# Patient Record
Sex: Male | Born: 1970 | Race: Black or African American | Hispanic: No | Marital: Married | Smoking: Former smoker
Health system: Southern US, Community
[De-identification: ages and names within clinical notes are randomized; demographics above are authoritative.]

## PROBLEM LIST (undated history)

## (undated) DIAGNOSIS — K8020 Calculus of gallbladder without cholecystitis without obstruction: Secondary | ICD-10-CM

## (undated) DIAGNOSIS — M545 Low back pain, unspecified: Secondary | ICD-10-CM

## (undated) DIAGNOSIS — R51 Headache: Secondary | ICD-10-CM

## (undated) DIAGNOSIS — G8929 Other chronic pain: Secondary | ICD-10-CM

## (undated) DIAGNOSIS — K859 Acute pancreatitis without necrosis or infection, unspecified: Secondary | ICD-10-CM

## (undated) DIAGNOSIS — I10 Essential (primary) hypertension: Secondary | ICD-10-CM

## (undated) HISTORY — DX: Essential (primary) hypertension: I10

## (undated) HISTORY — DX: Calculus of gallbladder without cholecystitis without obstruction: K80.20

## (undated) HISTORY — DX: Low back pain: M54.5

## (undated) HISTORY — PX: APPENDECTOMY: SHX54

## (undated) HISTORY — DX: Low back pain, unspecified: M54.50

## (undated) HISTORY — DX: Acute pancreatitis without necrosis or infection, unspecified: K85.90

## (undated) HISTORY — PX: LAPAROTOMY: SHX154

## (undated) HISTORY — PX: CHOLECYSTECTOMY: SHX55

## (undated) HISTORY — PX: COLON SURGERY: SHX602

## (undated) HISTORY — DX: Other chronic pain: G89.29

## (undated) HISTORY — DX: Headache: R51

---

## 2004-04-28 ENCOUNTER — Emergency Department (HOSPITAL_COMMUNITY): Admission: EM | Admit: 2004-04-28 | Discharge: 2004-04-29 | Payer: Self-pay | Admitting: Emergency Medicine

## 2006-01-19 ENCOUNTER — Emergency Department (HOSPITAL_COMMUNITY): Admission: EM | Admit: 2006-01-19 | Discharge: 2006-01-19 | Payer: Self-pay | Admitting: Family Medicine

## 2006-06-22 ENCOUNTER — Emergency Department (HOSPITAL_COMMUNITY): Admission: EM | Admit: 2006-06-22 | Discharge: 2006-06-22 | Payer: Self-pay | Admitting: Family Medicine

## 2007-06-29 ENCOUNTER — Emergency Department (HOSPITAL_COMMUNITY): Admission: EM | Admit: 2007-06-29 | Discharge: 2007-06-29 | Payer: Self-pay | Admitting: Family Medicine

## 2007-08-22 ENCOUNTER — Encounter: Admission: RE | Admit: 2007-08-22 | Discharge: 2007-10-27 | Payer: Self-pay | Admitting: Occupational Medicine

## 2007-09-29 ENCOUNTER — Encounter: Admission: RE | Admit: 2007-09-29 | Discharge: 2007-09-29 | Payer: Self-pay | Admitting: Occupational Medicine

## 2008-05-11 ENCOUNTER — Inpatient Hospital Stay (HOSPITAL_COMMUNITY): Admission: EM | Admit: 2008-05-11 | Discharge: 2008-05-15 | Payer: Self-pay | Admitting: Emergency Medicine

## 2008-05-14 ENCOUNTER — Encounter (INDEPENDENT_AMBULATORY_CARE_PROVIDER_SITE_OTHER): Payer: Self-pay | Admitting: General Surgery

## 2009-09-08 ENCOUNTER — Emergency Department (HOSPITAL_COMMUNITY): Admission: EM | Admit: 2009-09-08 | Discharge: 2009-09-08 | Payer: Self-pay | Admitting: Family Medicine

## 2009-09-15 ENCOUNTER — Ambulatory Visit: Payer: Self-pay | Admitting: Internal Medicine

## 2009-09-15 DIAGNOSIS — I10 Essential (primary) hypertension: Secondary | ICD-10-CM

## 2009-09-15 DIAGNOSIS — Z87442 Personal history of urinary calculi: Secondary | ICD-10-CM | POA: Insufficient documentation

## 2009-10-09 ENCOUNTER — Ambulatory Visit: Payer: Self-pay | Admitting: Internal Medicine

## 2009-10-09 DIAGNOSIS — B353 Tinea pedis: Secondary | ICD-10-CM

## 2009-10-09 DIAGNOSIS — B351 Tinea unguium: Secondary | ICD-10-CM | POA: Insufficient documentation

## 2010-02-12 ENCOUNTER — Encounter: Payer: Self-pay | Admitting: Internal Medicine

## 2010-02-24 ENCOUNTER — Telehealth: Payer: Self-pay | Admitting: Internal Medicine

## 2010-03-02 ENCOUNTER — Ambulatory Visit: Payer: Self-pay | Admitting: Internal Medicine

## 2010-03-02 DIAGNOSIS — M5440 Lumbago with sciatica, unspecified side: Secondary | ICD-10-CM | POA: Insufficient documentation

## 2010-03-02 DIAGNOSIS — M549 Dorsalgia, unspecified: Secondary | ICD-10-CM | POA: Insufficient documentation

## 2010-03-06 ENCOUNTER — Ambulatory Visit (HOSPITAL_COMMUNITY): Admission: RE | Admit: 2010-03-06 | Discharge: 2010-03-06 | Payer: Self-pay | Admitting: Internal Medicine

## 2010-03-10 ENCOUNTER — Telehealth: Payer: Self-pay | Admitting: Internal Medicine

## 2010-04-13 ENCOUNTER — Ambulatory Visit: Payer: Self-pay | Admitting: Internal Medicine

## 2010-04-13 ENCOUNTER — Encounter (INDEPENDENT_AMBULATORY_CARE_PROVIDER_SITE_OTHER): Payer: Self-pay | Admitting: *Deleted

## 2010-04-13 DIAGNOSIS — R51 Headache: Secondary | ICD-10-CM

## 2010-04-13 DIAGNOSIS — R519 Headache, unspecified: Secondary | ICD-10-CM | POA: Insufficient documentation

## 2010-05-07 ENCOUNTER — Encounter
Admission: RE | Admit: 2010-05-07 | Discharge: 2010-06-30 | Payer: Self-pay | Source: Home / Self Care | Attending: Internal Medicine | Admitting: Internal Medicine

## 2010-05-15 ENCOUNTER — Ambulatory Visit: Payer: Self-pay | Admitting: Physical Medicine & Rehabilitation

## 2010-05-19 ENCOUNTER — Encounter: Payer: Self-pay | Admitting: Internal Medicine

## 2010-05-30 ENCOUNTER — Encounter
Admission: RE | Admit: 2010-05-30 | Discharge: 2010-05-30 | Payer: Self-pay | Source: Home / Self Care | Attending: Neurology | Admitting: Neurology

## 2010-06-17 ENCOUNTER — Ambulatory Visit (HOSPITAL_BASED_OUTPATIENT_CLINIC_OR_DEPARTMENT_OTHER)
Admission: RE | Admit: 2010-06-17 | Discharge: 2010-06-17 | Payer: Self-pay | Source: Home / Self Care | Attending: Neurology | Admitting: Neurology

## 2010-06-25 ENCOUNTER — Encounter
Admission: RE | Admit: 2010-06-25 | Discharge: 2010-06-30 | Payer: Self-pay | Source: Home / Self Care | Attending: Physical Medicine & Rehabilitation | Admitting: Physical Medicine & Rehabilitation

## 2010-06-26 ENCOUNTER — Ambulatory Visit
Admission: RE | Admit: 2010-06-26 | Discharge: 2010-06-26 | Payer: Self-pay | Source: Home / Self Care | Attending: Internal Medicine | Admitting: Internal Medicine

## 2010-06-28 LAB — CONVERTED CEMR LAB
ALT: 35 units/L (ref 0–53)
AST: 36 units/L (ref 0–37)
Albumin: 4.3 g/dL (ref 3.5–5.2)
Alkaline Phosphatase: 55 units/L (ref 39–117)
BUN: 9 mg/dL (ref 6–23)
Basophils Absolute: 0 10*3/uL (ref 0.0–0.1)
Basophils Relative: 0.6 % (ref 0.0–3.0)
Bilirubin, Direct: 0.1 mg/dL (ref 0.0–0.3)
CO2: 32 meq/L (ref 19–32)
Calcium: 9.3 mg/dL (ref 8.4–10.5)
Chloride: 104 meq/L (ref 96–112)
Cholesterol: 187 mg/dL (ref 0–200)
Creatinine, Ser: 1.1 mg/dL (ref 0.4–1.5)
Direct LDL: 108.7 mg/dL
Eosinophils Absolute: 0.1 10*3/uL (ref 0.0–0.7)
Eosinophils Relative: 1.2 % (ref 0.0–5.0)
GFR calc non Af Amer: 99.52 mL/min (ref >60)
Glucose, Bld: 82 mg/dL (ref 70–99)
HCT: 44.9 % (ref 39.0–52.0)
HDL: 35.8 mg/dL — ABNORMAL LOW (ref >39.00)
Hemoglobin: 15.5 g/dL (ref 13.0–17.0)
Lymphocytes Relative: 35.1 % (ref 12.0–46.0)
Lymphs Abs: 2.4 10*3/uL (ref 0.7–4.0)
MCHC: 34.5 g/dL (ref 30.0–36.0)
MCV: 83.8 fL (ref 78.0–100.0)
Monocytes Absolute: 0.5 10*3/uL (ref 0.1–1.0)
Monocytes Relative: 7.8 % (ref 3.0–12.0)
Neutro Abs: 3.7 10*3/uL (ref 1.4–7.7)
Neutrophils Relative %: 55.3 % (ref 43.0–77.0)
Platelets: 329 10*3/uL (ref 150.0–400.0)
Potassium: 4.1 meq/L (ref 3.5–5.1)
RBC: 5.35 M/uL (ref 4.22–5.81)
RDW: 13.5 % (ref 11.5–14.6)
Sodium: 142 meq/L (ref 135–145)
TSH: 2.06 microintl units/mL (ref 0.35–5.50)
Total Bilirubin: 0.8 mg/dL (ref 0.3–1.2)
Total CHOL/HDL Ratio: 5
Total Protein: 7.6 g/dL (ref 6.0–8.3)
Triglycerides: 255 mg/dL — ABNORMAL HIGH (ref 0.0–149.0)
VLDL: 51 mg/dL — ABNORMAL HIGH (ref 0.0–40.0)
WBC: 6.8 10*3/uL (ref 4.5–10.5)

## 2010-06-30 NOTE — Assessment & Plan Note (Signed)
Summary: back pain--d/t---stc   Vital Signs:  Patient profile:   40 year old male Height:      70 inches Weight:      253 pounds BMI:     36.43 O2 Sat:      97 % on Room air Temp:     98.2 degrees F oral Pulse rate:   67 / minute Pulse rhythm:   regular Resp:     16 per minute BP sitting:   130 / 82  (left arm) Cuff size:   large  Vitals Entered By: Rock Nephew CMA (March 02, 2010 3:44 PM)  Nutrition Counseling: Patient's BMI is greater than 25 and therefore counseled on weight management options.  O2 Flow:  Room air CC: pt c/o back pain x 2wks, Back pain   Primary Care Provider:  Etta Grandchild MD  CC:  pt c/o back pain x 2wks and Back pain.  History of Present Illness:  Back Pain      This is a 40 year old man who presents with Back pain.  The symptoms began 2 weeks ago.  The intensity is described as moderate.  The patient denies fever, chills, weakness, loss of sensation, fecal incontinence, urinary incontinence, urinary retention, dysuria, rest pain, inability to work, and inability to care for self.  The pain is located in the left low back.  The pain radiates to the left leg below the knee.  The pain is made worse by flexion and extension.  The pain is made better by inactivity, NSAID medications, heat, and ice.    Preventive Screening-Counseling & Management  Alcohol-Tobacco     Alcohol drinks/day: 0     Smoking Status: never     Tobacco Counseling: not indicated; no tobacco use  Hep-HIV-STD-Contraception     Hepatitis Risk: no risk noted     HIV Risk: no risk noted     STD Risk: no risk noted     TSE monthly: yes     Testicular SE Education/Counseling to perform regular STE  Clinical Review Panels:  Immunizations   Last Tetanus Booster:  Td (09/28/2008)   Last Flu Vaccine:  Historical (02/28/2009)   Last Pneumovax:  Historical (05/31/2008)  Lipid Management   Cholesterol:  187 (10/09/2009)   HDL (good cholesterol):  35.80  (10/09/2009)  Diabetes Management   Creatinine:  1.1 (10/09/2009)   Last Flu Vaccine:  Historical (02/28/2009)   Last Pneumovax:  Historical (05/31/2008)  CBC   WBC:  6.8 (10/09/2009)   RBC:  5.35 (10/09/2009)   Hgb:  15.5 (10/09/2009)   Hct:  44.9 (10/09/2009)   Platelets:  329.0 (10/09/2009)   MCV  83.8 (10/09/2009)   MCHC  34.5 (10/09/2009)   RDW  13.5 (10/09/2009)   PMN:  55.3 (10/09/2009)   Lymphs:  35.1 (10/09/2009)   Monos:  7.8 (10/09/2009)   Eosinophils:  1.2 (10/09/2009)   Basophil:  0.6 (10/09/2009)  Complete Metabolic Panel   Glucose:  82 (10/09/2009)   Sodium:  142 (10/09/2009)   Potassium:  4.1 (10/09/2009)   Chloride:  104 (10/09/2009)   CO2:  32 (10/09/2009)   BUN:  9 (10/09/2009)   Creatinine:  1.1 (10/09/2009)   Albumin:  4.3 (10/09/2009)   Total Protein:  7.6 (10/09/2009)   Calcium:  9.3 (10/09/2009)   Total Bili:  0.8 (10/09/2009)   Alk Phos:  55 (10/09/2009)   SGPT (ALT):  35 (10/09/2009)   SGOT (AST):  36 (10/09/2009)   Medications Prior  to Update: 1)  Terbinafine Hcl 250 Mg Tabs (Terbinafine Hcl) .... One By Mouth Once Daily For Toenail Fungus 2)  Loprox 0.77 % Gel (Ciclopirox) .... Apply To Feet Two Times A Day For 14 Days For Foot Fungus.  Current Medications (verified): 1)  Terbinafine Hcl 250 Mg Tabs (Terbinafine Hcl) .... One By Mouth Once Daily For Toenail Fungus 2)  Tramadol Hcl 50 Mg Tabs (Tramadol Hcl) .... 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 Family History of Arthritis Family History Diabetes 1st degree relative  Social History: Reviewed history from 09/15/2009 and no changes required. 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    Physical Exam  General:  alert, well-developed, well-nourished, well-hydrated, appropriate dress, normal appearance, healthy-appearing, and cooperative to examination.   Mouth:  Oral mucosa and oropharynx without lesions or exudates.  Teeth in good repair. Neck:  supple, full ROM, no masses, no thyromegaly, no thyroid nodules or tenderness, no JVD, normal carotid upstroke, no carotid bruits, no cervical lymphadenopathy, and no neck tenderness.   Lungs:  normal respiratory effort, no intercostal retractions, no accessory muscle use, normal breath sounds, no dullness, no fremitus, no crackles, and no wheezes.   Heart:  normal rate, regular rhythm, no murmur, no gallop, no rub, and no JVD.   Abdomen:  soft, non-tender, normal bowel sounds, no distention, no masses, no guarding, no rigidity, no rebound tenderness, no abdominal hernia, no inguinal hernia, no hepatomegaly, no splenomegaly, and abdominal scar(s).   Msk:  No deformity or scoliosis noted of thoracic or lumbar spine.   Pulses:  R and L carotid,radial,femoral,dorsalis pedis and posterior tibial pulses are full and equal  bilaterally Extremities:  No clubbing, cyanosis, edema, or deformity noted with normal full range of motion of all joints.   Neurologic:  No cranial nerve deficits noted. Station and gait are normal. Plantar reflexes are down-going bilaterally. DTRs are symmetrical throughout. Sensory, motor and coordinative functions appear intact. Skin:  turgor normal, color normal,  no suspicious lesions, no ecchymoses, no petechiae, no purpura, no ulcerations, no edema, and tattoo(s).  He has diffuse scaling on the plantar surfaces of both feet. Psych:  Cognition and judgment appear intact. Alert and cooperative with normal attention span and concentration. No apparent delusions, illusions, hallucinations   Detailed Back/Spine Exam  General:    obese.    Gait:    Normal heel-toe gait pattern bilaterally.    Lumbosacral Exam:  Inspection-deformity:    Normal Palpation-spinal tenderness:  Normal Range of Motion:    Forward Flexion:   85 degrees    Hyperextension:   30 degrees    Right Lateral Bend:   30 degrees  Left Lateral Bend:   30 degrees Squatting:  normal Lying Straight Leg Raise:    Right:  negative    Left:  negative Sitting Straight Leg Raise:    Right:  negative    Left:  negative Reverse Straight Leg Raise:    Right:  negative    Left:  negative Contralateral Straight Leg Raise:    Right:  negative    Left:  negative Sciatic Notch:    There is no sciatic notch tenderness. Toe Walking:    Right:  normal    Left:  normal Heel Walking:    Right:  normal    Left:  normal   Impression & Recommendations:  Problem # 1:  BACK PAIN (ICD-724.5) Assessment New  His updated medication list for this problem includes:    Tramadol Hcl 50 Mg Tabs (Tramadol hcl) ..... One by mouth qid as needed for low back pain  Problem # 2:  LUMBAR RADICULOPATHY, LEFT (ICD-724.4) Assessment: New will look for spinal cord lesion, hnp, ddd, spinal stenosis, nerve impingement His updated  medication list for this problem includes:    Tramadol Hcl 50 Mg Tabs (Tramadol hcl) ..... One by mouth qid as needed for low back pain  Orders: Radiology Referral (Radiology)  Problem # 3:  ELEVATED BP W/O HYPERTENSION (ICD-796.2) Assessment: Improved  BP today: 130/82 Prior BP: 120/82 (10/09/2009)  Labs Reviewed: Creat: 1.1 (10/09/2009) Chol: 187 (10/09/2009)   HDL: 35.80 (10/09/2009)   TG: 255.0 (10/09/2009)  Instructed in low sodium diet (DASH Handout) and behavior modification.    Complete Medication List: 1)  Terbinafine Hcl 250 Mg Tabs (Terbinafine hcl) .... One by mouth once daily for toenail fungus 2)  Tramadol Hcl 50 Mg Tabs (Tramadol hcl) .... One by mouth qid as needed for low back pain  Patient Instructions: 1)  Please schedule a follow-up appointment in 1 month. 2)  It is important that you exercise regularly at least 20 minutes 5 times a week. If you develop chest pain, have severe difficulty breathing, or feel very tired , stop exercising immediately and seek medical attention. 3)  You need to lose weight. Consider a lower calorie diet and regular exercise.  4)  Take 650-1000mg  of Tylenol every 4-6 hours as needed for relief of pain or comfort of fever AVOID taking more than 4000mg   in a 24 hour period (can cause liver damage in higher doses). 5)  Take 400-600mg  of Ibuprofen (Advil, Motrin) with food every 4-6 hours as needed for relief of pain or comfort of fever. 6)  Most patients (90%) with low back pain will improve with time (2-6 weeks). Keep active but avoid activities that are painful. Apply moist heat and/or ice to lower back several times a day. Prescriptions: TRAMADOL HCL 50 MG TABS (TRAMADOL HCL) One by mouth QID as needed for low back pain  #50 x 3   Entered and Authorized by:   Etta Grandchild MD   Signed by:   Etta Grandchild MD on 03/02/2010   Method used:   Electronically to        [facility]* (retail)       [address]        [address] Shipping/mailing       [place], [place]  [address]       Ph: [phone]       Fax: [phone]   RxID:   [id number]

## 2010-06-30 NOTE — Letter (Signed)
Summary: Out of Work  [facility]  [address] [place], [place] [address]   Phone: [phone]  Fax: [phone]    March 02, 2010   Employee:  [name]    To Whom It May Concern:   For Medical reasons, please excuse the above named employee from work for the following dates:  Monday 03/02/2010  If you need additional information, please feel free to contact our office.         Sincerely,    Alvy Beal A CMA for Dr. Sanda Linger

## 2010-06-30 NOTE — Assessment & Plan Note (Signed)
Summary: PER SARAH 1 MTH FU--STC   Vital Signs:  Patient profile:   40 year old male Height:      70 inches Weight:      254 pounds BMI:     36.58 O2 Sat:      97 % on Room air Temp:     98.2 degrees F oral Pulse rate:   64 / minute Pulse rhythm:   regular Resp:     16 per minute BP sitting:   142 / 90  (left arm) Cuff size:   large  Vitals Entered By: Rock Nephew CMA (April 13, 2010 9:47 AM)  Nutrition Counseling: Patient's BMI is greater than 25 and therefore counseled on weight management options.  O2 Flow:  Room air CC: follow-up visit Is Patient Diabetic? No Pain Assessment Patient in pain? no       Does patient need assistance? Functional Status Self care Ambulation Normal   Primary Care Provider:  Etta Grandchild MD  CC:  follow-up visit.  History of Present Illness: He returns for f/up and despite very little abnormality on his MRI he still has low back pain. His low back pain responds well to Goody's and tramadol.  Also, he tells me that he has had chronic daily headache since 2010 when he sustained 2 head injuries while working ay [employer]. His wife is concerned b/c she believes that he takes a medicine everyday for headache.  He does not want to take a med for high blood pressure but he requests an updated referral for a nutrition consultation.  Preventive Screening-Counseling & Management  Alcohol-Tobacco     Alcohol drinks/day: 0     Alcohol Counseling: not indicated; patient does not drink     Smoking Status: never     Tobacco Counseling: not indicated; no tobacco use  Hep-HIV-STD-Contraception     Hepatitis Risk: no risk noted     HIV Risk: no risk noted     STD Risk: no risk noted     TSE monthly: yes     Testicular SE Education/Counseling to perform regular STE      Sexual History:  currently monogamous.        Drug Use:  never and no.        Blood Transfusions:  no.    Medications Prior to Update: 1)  Terbinafine Hcl 250 Mg Tabs  (Terbinafine Hcl) .... One By Mouth Once Daily For Toenail Fungus 2)  Tramadol Hcl 50 Mg Tabs (Tramadol Hcl) .... One By Mouth Qid As Needed For Low Back Pain  Current Medications (verified): 1)  Terbinafine Hcl 250 Mg Tabs (Terbinafine Hcl) .... One By Mouth Once Daily For Toenail Fungus 2)  Tramadol Hcl 50 Mg Tabs (Tramadol Hcl) .... One By Mouth Qid As Needed For Low Back Pain  Allergies (verified): No Known Drug Allergies  Past History:  Past Medical History: Last updated: 09/15/2009 Gallstones ----> hepatitis/pancreatitis in 2009 [facility]------------> headaches Nephrolithiasis, hx of  Past Surgical History: Last updated: 09/15/2009 Appendectomy Cholecystectomy Exploratory laparotomy  Family History: Last updated: 09/15/2009 Family History of Arthritis Family History Diabetes 1st degree relative  Social History: Last updated: 04/13/2010 Occupation: [employer] Married Never Smoked Alcohol use-no Drug use-no Regular exercise-yes  Risk Factors: Alcohol Use: 0 (04/13/2010) Exercise: yes (09/15/2009)  Risk Factors: Smoking Status: never (04/13/2010)  Family History: Reviewed history from 09/15/2009 and no changes required. Family History of Arthritis Family History Diabetes 1st degree relative  Social History: Reviewed history from 09/15/2009 and  no changes required. Occupation: [employer] Married Never Smoked Alcohol use-no Drug use-no Regular exercise-yes  Review of Systems       The patient complains of weight gain and headaches.  The patient denies anorexia, fever, weight loss, decreased hearing, hoarseness, chest pain, syncope, dyspnea on exertion, peripheral edema, prolonged cough, hemoptysis, abdominal pain, hematuria, suspicious skin lesions, transient blindness, difficulty walking, and depression.   Neuro:  Denies brief paralysis, difficulty with concentration, disturbances in coordination, falling down, inability to speak, memory loss, numbness, poor  balance, seizures, sensation of room spinning, tingling, tremors, visual disturbances, and weakness.  Physical Exam  General:  alert, well-developed, well-nourished, well-hydrated, appropriate dress, normal appearance, healthy-appearing, and overweight-appearing.   Head:  normocephalic, atraumatic, no abnormalities observed, and no abnormalities palpated.   Eyes:  vision grossly intact, pupils equal, pupils round, and pupils reactive to light.   Mouth:  Oral mucosa and oropharynx without lesions or exudates.  Teeth in good repair. Neck:  supple, full ROM, no masses, no thyromegaly, no thyroid nodules or tenderness, no JVD, normal carotid upstroke, no carotid bruits, no cervical lymphadenopathy, and no neck tenderness.   Lungs:  normal respiratory effort, no intercostal retractions, no accessory muscle use, normal breath sounds, no dullness, no fremitus, no crackles, and no wheezes.   Heart:  normal rate, regular rhythm, no murmur, no gallop, no rub, and no JVD.   Abdomen:  soft, non-tender, normal bowel sounds, no distention, no masses, no guarding, no rigidity, no rebound tenderness, no abdominal hernia, no inguinal hernia, no hepatomegaly, no splenomegaly, and abdominal scar(s).   Msk:  No deformity or scoliosis noted of thoracic or lumbar spine.   Pulses:  R and L carotid,radial,femoral,dorsalis pedis and posterior tibial pulses are full and equal bilaterally Extremities:  No clubbing, cyanosis, edema, or deformity noted with normal full range of motion of all joints.   Neurologic:  No cranial nerve deficits noted. Station and gait are normal. Plantar reflexes are down-going bilaterally. DTRs are symmetrical throughout. Sensory, motor and coordinative functions appear intact. Skin:  turgor normal, color normal, no rashes, no suspicious lesions, no ecchymoses, no petechiae, no purpura, no ulcerations, and no edema.   Cervical Nodes:  no anterior cervical adenopathy and no posterior cervical  adenopathy.   Psych:  Cognition and judgment appear intact. Alert and cooperative with normal attention span and concentration. No apparent delusions, illusions, hallucinations   Impression & Recommendations:  Problem # 1:  HEADACHE (ICD-784.0) Assessment New  His updated medication list for this problem includes:    Tramadol Hcl 50 Mg Tabs (Tramadol hcl) ..... One by mouth qid as needed for low back pain  Orders: Neurology Referral (Neuro)  Problem # 2:  BACK PAIN (ICD-724.5) Assessment: Unchanged  His updated medication list for this problem includes:    Tramadol Hcl 50 Mg Tabs (Tramadol hcl) ..... One by mouth qid as needed for low back pain  Orders: Pain Clinic Referral (Pain)  Problem # 3:  ELEVATED BP W/O HYPERTENSION (ICD-796.2) Assessment: Deteriorated  he refuses to consider taking a medication Orders: Nutrition Referral (Nutrition)  BP today: 142/90 Prior BP: 130/82 (03/02/2010)  Labs Reviewed: Creat: 1.1 (10/09/2009) Chol: 187 (10/09/2009)   HDL: 35.80 (10/09/2009)   TG: 255.0 (10/09/2009)  Instructed in low sodium diet (DASH Handout) and behavior modification.    Complete Medication List: 1)  Terbinafine Hcl 250 Mg Tabs (Terbinafine hcl) .... One by mouth once daily for toenail fungus 2)  Tramadol Hcl 50 Mg Tabs (Tramadol hcl) .Marland KitchenMarland KitchenMarland Kitchen  One by mouth qid as needed for low back pain  Patient Instructions: 1)  Please schedule a follow-up appointment in 2 months. 2)  It is important that you exercise regularly at least 20 minutes 5 times a week. If you develop chest pain, have severe difficulty breathing, or feel very tired , stop exercising immediately and seek medical attention. 3)  You need to lose weight. Consider a lower calorie diet and regular exercise.  4)  Check your Blood Pressure regularly. If it is above 140/90: you should make an appointment.   Orders Added: 1)  Nutrition Referral [Nutrition] 2)  Pain Clinic Referral [Pain] 3)  Neurology Referral  [Neuro] 4)  Est. Patient Level V [82956]

## 2010-06-30 NOTE — Assessment & Plan Note (Signed)
Summary: NEW / [facility] / # / CD   Vital Signs:  Patient profile:   40 year old male Height:      70 inches Weight:      255.25 pounds BMI:     36.76 O2 Sat:      92 % on Room air Temp:     97.5 degrees F oral Pulse rate:   81 / minute Pulse rhythm:   regular Resp:     16 per minute BP supine:   140 / 90  (right arm) BP sitting:   124 / 82  (left arm) Cuff size:   large  Vitals Entered By: Rock Nephew CMA (September 15, 2009 10:22 AM)  Nutrition Counseling: Patient's BMI is greater than 25 and therefore counseled on weight management options.  O2 Flow:  Room air  Primary Care Provider:  Etta Grandchild MD   History of Present Illness: New to me this young man wants to establish with a PCP today but he does not want to do a physical. He has no complaints today.  Preventive Screening-Counseling & Management  Alcohol-Tobacco     Alcohol drinks/day: 0     Smoking Status: never  Caffeine-Diet-Exercise     Does Patient Exercise: yes  Hep-HIV-STD-Contraception     Hepatitis Risk: no risk noted     HIV Risk: no risk noted     STD Risk: no risk noted     TSE monthly: yes     Testicular SE Education/Counseling to perform regular STE  Safety-Violence-Falls     Seat Belt Use: yes     Helmet Use: yes     Firearms in the Home: no firearms in the home     Smoke Detectors: yes     Violence in the Home: no risk noted     Sexual Abuse: no      Sexual History:  currently monogamous.        Drug Use:  never and no.        Blood Transfusions:  no.    Clinical Review Panels:  Immunizations   Last Tetanus Booster:  Td (09/28/2008)   Last Flu Vaccine:  Historical (02/28/2009)   Last Pneumovax:  Historical (05/31/2008)  Diabetes Management   Last Flu Vaccine:  Historical (02/28/2009)   Last Pneumovax:  Historical (05/31/2008)   Medications Prior to Update: 1)  None  Current Medications (verified): 1)  None  Allergies (verified): No Known Drug Allergies  Past  History:  Past Medical History: Gallstones ----> hepatitis/pancreatitis in 2009 [facility]------------> headaches Nephrolithiasis, hx of  Past Surgical History: Appendectomy Cholecystectomy Exploratory laparotomy  Family History: Reviewed history and no changes required. Family History of Arthritis Family History Diabetes 1st degree relative  Social History: Reviewed history and no changes required. 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    Physical Exam  General:  alert, well-developed, well-nourished, well-hydrated, appropriate dress, normal appearance, healthy-appearing, and cooperative to examination.   Head:  normocephalic, atraumatic, no abnormalities observed, and no abnormalities palpated.   Eyes:  vision grossly intact, pupils equal, pupils round, and pupils reactive to light.   Mouth:  Oral mucosa and oropharynx without lesions or exudates.  Teeth in good repair. Neck:  supple, full ROM, no masses, no thyromegaly, no thyroid nodules or tenderness, no JVD, normal carotid upstroke, no carotid bruits, no cervical lymphadenopathy, and no neck tenderness.   Lungs:  normal respiratory effort, no intercostal retractions, no accessory muscle use, normal breath sounds, no dullness, no fremitus, no crackles, and no wheezes.   Heart:  normal rate, regular rhythm, no murmur, no gallop, no rub, and no JVD.   Abdomen:  soft, non-tender, normal bowel sounds, no distention, no masses, no guarding, no rigidity, no rebound tenderness, no abdominal  hernia, no inguinal hernia, no hepatomegaly, no splenomegaly, and abdominal scar(s).   Msk:  No deformity or scoliosis noted of thoracic or lumbar spine.   Pulses:  R and L carotid,radial,femoral,dorsalis pedis and posterior tibial pulses are full and equal bilaterally Extremities:  No clubbing, cyanosis, edema, or deformity noted with normal full range of motion of all joints.   Neurologic:  No cranial nerve deficits noted. Station and gait are normal. Plantar reflexes are down-going bilaterally. DTRs are symmetrical throughout. Sensory, motor and coordinative functions appear intact. Skin:  turgor normal, color normal, no rashes, no suspicious lesions, no ecchymoses, no petechiae, no purpura, no ulcerations, no edema, and tattoo(s).   Cervical Nodes:  no anterior cervical adenopathy and no posterior cervical adenopathy.   Axillary Nodes:  no R axillary adenopathy and no L axillary adenopathy.   Inguinal Nodes:  no R inguinal adenopathy and no L inguinal adenopathy.   Psych:  Cognition and judgment appear intact. Alert and cooperative with normal attention span and concentration. No apparent delusions, illusions, hallucinations   Impression & Recommendations:  Problem # 1:  ELEVATED BP W/O HYPERTENSION (ICD-796.2) Assessment New  BP today: 124/82  Instructed in low sodium diet (DASH Handout) and behavior modification.    Patient Instructions: 1)  Please schedule a follow-up appointment in 1 month. 2)  It is important that you exercise regularly at least 20 minutes 5 times a week. If you develop chest pain, have severe difficulty breathing, or feel very tired , stop exercising immediately and seek medical attention. 3)  You need to lose weight. Consider a lower calorie diet and regular exercise.   Preventive Care Screening  Last Tetanus Booster:    Date:  09/28/2008    Results:  Td     Immunization History:  Influenza Immunization History:    Influenza:  historical  (02/28/2009)  Pneumovax Immunization History:    Pneumovax:  historical (05/31/2008)

## 2010-06-30 NOTE — Letter (Signed)
Summary: [facility]  [facility]   Imported By: Sherian Rein 02/16/2010 10:34:18  _____________________________________________________________________  External Attachment:    Type:   Image     Comment:   External Document

## 2010-06-30 NOTE — Progress Notes (Signed)
Summary: RESULTS OF MRI   Phone Note Call from Patient Call back at Home Phone [phone]   Summary of Call: Patient is requesting results of MRI.  Initial call taken by: Lamar Sprinkles, CMA,  March 10, 2010 3:44 PM  Follow-up for Phone Call        mild arthritis Follow-up by: Etta Grandchild MD,  March 10, 2010 6:24 PM  Additional Follow-up for Phone Call Additional follow up Details #1::        Pt informed  Additional Follow-up by: Lamar Sprinkles, CMA,  March 11, 2010 10:03 AM

## 2010-06-30 NOTE — Assessment & Plan Note (Signed)
Summary: cpx/will come fasting/#/cd   Vital Signs:  Patient profile:   40 year old male Height:      70 inches Weight:      250 pounds BMI:     36.00 O2 Sat:      97 % on Room air Temp:     98.5 degrees F oral Pulse rate:   64 / minute Pulse rhythm:   regular Resp:     16 per minute BP sitting:   120 / 82  (left arm) Cuff size:   large  Vitals Entered By: Rock Nephew CMA (Oct 09, 2009 9:08 AM)  Nutrition Counseling: Patient's BMI is greater than 25 and therefore counseled on weight management options.  O2 Flow:  Room air CC: CPX w/labs Is Patient Diabetic? No Pain Assessment Patient in pain? no        Primary Care Provider:  Etta Grandchild MD  CC:  CPX w/labs.  History of Present Illness: He returns for a complete physical and offers complaints about toenail fungus and foot rash with itching and scaling.  Preventive Screening-Counseling & Management  Alcohol-Tobacco     Alcohol drinks/day: 0     Smoking Status: never  Hep-HIV-STD-Contraception     Hepatitis Risk: no risk noted     HIV Risk: no risk noted     STD Risk: no risk noted     TSE monthly: yes     Testicular SE Education/Counseling to perform regular STE      Sexual History:  currently monogamous.        Drug Use:  never and no.        Blood Transfusions:  no.    Clinical Review Panels:  Immunizations   Last Tetanus Booster:  Td (09/28/2008)   Last Flu Vaccine:  Historical (02/28/2009)   Last Pneumovax:  Historical (05/31/2008)  Diabetes Management   Last Flu Vaccine:  Historical (02/28/2009)   Last Pneumovax:  Historical (05/31/2008)   Medications Prior to Update: 1)  None  Current Medications (verified): 1)  None  Allergies (verified): No Known Drug Allergies  Past History:  Past Medical History: Reviewed history from 09/15/2009 and no changes required. Gallstones ----> hepatitis/pancreatitis in 2009 [facility]------------> headaches Nephrolithiasis, hx of  Past Surgical  History: Reviewed history from 09/15/2009 and no changes required. Appendectomy Cholecystectomy Exploratory laparotomy  Family History: Reviewed history from 09/15/2009 and no changes required. Family History of Arthritis Family History Diabetes 1st degree relative  Social History: Reviewed history from 09/15/2009 and no changes required. 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  The patient denies anorexia, fever, weight loss, chest pain, syncope, dyspnea on exertion, peripheral edema, prolonged cough, headaches, hemoptysis, abdominal pain, melena, hematochezia, severe indigestion/heartburn, hematuria, genital sores, suspicious skin lesions, difficulty walking, depression, enlarged lymph nodes, angioedema, and testicular masses.   CV:  Denies chest pain or discomfort, difficulty breathing while lying down, leg cramps with exertion, lightheadness, near fainting, palpitations, shortness of breath with exertion, and swelling of feet. GU:  Denies decreased libido, discharge, dysuria, erectile dysfunction, hematuria, incontinence, nocturia, urinary frequency, and urinary hesitancy. Derm:  Complains of changes in nail beds, itching, and rash; denies changes in color of skin, dryness, excessive perspiration, flushing, hair loss, insect bite(s), lesion(s), and poor wound healing.  Physical Exam  General:  alert, well-developed, well-nourished, well-hydrated, appropriate dress, normal appearance, healthy-appearing, and  cooperative to examination.   Head:  normocephalic, atraumatic, no abnormalities observed, and no abnormalities palpated.   Eyes:  vision grossly intact, pupils equal, pupils round, and pupils reactive to light.   Ears:  R ear normal and L ear normal.   Mouth:  Oral mucosa and oropharynx without lesions or exudates.  Teeth in good repair. Neck:  supple, full  ROM, no masses, no thyromegaly, no thyroid nodules or tenderness, no JVD, normal carotid upstroke, no carotid bruits, no cervical lymphadenopathy, and no neck tenderness.   Lungs:  normal respiratory effort, no intercostal retractions, no accessory muscle use, normal breath sounds, no dullness, no fremitus, no crackles, and no wheezes.   Heart:  normal rate, regular rhythm, no murmur, no gallop, no rub, and no JVD.   Abdomen:  soft, non-tender, normal bowel sounds, no distention, no masses, no guarding, no rigidity, no rebound tenderness, no abdominal hernia, no inguinal hernia, no hepatomegaly, no splenomegaly, and abdominal scar(s).   Genitalia:  circumcised, no hydrocele, no varicocele, no scrotal masses, no testicular masses or atrophy, no cutaneous lesions, and no urethral discharge.   Msk:  normal ROM, no joint tenderness, no joint swelling, no joint warmth, no redness over joints, no joint deformities, no joint instability, no crepitation, and no muscle atrophy.   Pulses:  R and L carotid,radial,femoral,dorsalis pedis and posterior tibial pulses are full and equal bilaterally Extremities:  No clubbing, cyanosis, edema, or deformity noted with normal full range of motion of all joints.  He has nail thickening, subung. debris, dark nails, and lysis involving 50% of his toenails. Neurologic:  No cranial nerve deficits noted. Station and gait are normal. Plantar reflexes are down-going bilaterally. DTRs are symmetrical throughout. Sensory, motor and coordinative functions appear intact. Skin:  turgor normal, color normal,  no suspicious lesions, no ecchymoses, no petechiae, no purpura, no ulcerations, no edema, and tattoo(s).  He has diffuse scaling on the plantar surfaces of both feet. Cervical Nodes:  no anterior cervical adenopathy and no posterior cervical adenopathy.   Axillary Nodes:  no R axillary adenopathy and no L axillary adenopathy.   Inguinal Nodes:  no R inguinal adenopathy and no L  inguinal adenopathy.   Psych:  Cognition and judgment appear intact. Alert and cooperative with normal attention span and concentration. No apparent delusions, illusions, hallucinations   Impression & Recommendations:  Problem # 1:  ROUTINE GENERAL MEDICAL EXAM@HEALTH  CARE FACL (ICD-V70.0)  Orders: Venipuncture ([address]) TLB-Lipid Panel (80061-LIPID) TLB-BMP (Basic Metabolic Panel-BMET) (80048-METABOL) TLB-CBC Platelet - w/Differential (85025-CBCD) TLB-Hepatic/Liver Function Pnl (80076-HEPATIC) TLB-TSH (Thyroid Stimulating Hormone) (84443-TSH) EKG w/ Interpretation ([address])  Td Booster: Td (09/28/2008)   Flu Vax: Historical (02/28/2009)   Pneumovax: Historical (05/31/2008)  Discussed using sunscreen, use of alcohol, drug use, self testicular exam, routine dental care, routine eye care, routine physical exam, seat belts, multiple vitamins,  and recommendations for immunizations.  Discussed exercise and checking cholesterol.    Problem # 2:  ELEVATED BP W/O HYPERTENSION (ICD-796.2) Assessment: Improved  Orders: Venipuncture ([address]) TLB-Lipid Panel (80061-LIPID) TLB-BMP (Basic Metabolic Panel-BMET) (80048-METABOL) TLB-CBC Platelet - w/Differential (85025-CBCD) TLB-Hepatic/Liver Function Pnl (80076-HEPATIC) TLB-TSH (Thyroid Stimulating Hormone) (84443-TSH) EKG w/ Interpretation (93000) Nutrition Referral (Nutrition)  BP today: 120/82 Prior BP: 140/90 (09/15/2009)  Instructed in low sodium diet (DASH Handout) and behavior modification.    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 One by mouth  once daily for toenail fungus    Loprox 0.77 % Gel (Ciclopirox) .Marland Kitchen... Apply to feet two times a day for 14 days for foot fungus.  Take medication as directed for full duration.   Problem # 4:  ONYCHOMYCOSIS, TOENAILS (ICD-110.1) Assessment: New  His  updated medication list for this problem includes:    Terbinafine Hcl 250 Mg Tabs (Terbinafine hcl) ..... One by mouth once daily for toenail fungus    Loprox 0.77 % Gel (Ciclopirox) .Marland Kitchen... Apply to feet two times a day for 14 days for foot fungus.  Discussed nail care and medication treatment options.   Complete Medication List: 1)  Terbinafine Hcl 250 Mg Tabs (Terbinafine hcl) .... One by mouth once daily for toenail fungus 2)  Loprox 0.77 % Gel (Ciclopirox) .... Apply to feet two times a day for 14 days for foot fungus.  Patient Instructions: 1)  It is important that you exercise regularly at least 20 minutes 5 times a week. If you develop chest pain, have severe difficulty breathing, or feel very tired , stop exercising immediately and seek medical attention. 2)  Check your Blood Pressure regularly. If it is above 140/90: you should make an appointment. 3)  Please schedule a follow-up appointment in 6 months. Prescriptions: LOPROX 0.77 % GEL (CICLOPIROX) Apply to feet two times a day for 14 days for foot fungus.  #60 gms x 1   Entered and Authorized by:   Etta Grandchild MD   Signed by:   Etta Grandchild MD on 10/09/2009   Method used:   Electronically to        [facility]* (retail)       [address]       [address] Shipping/mailing       [place], [place]  [address]       Ph: [phone]       Fax: [phone]   RxID:   [id number] TERBINAFINE HCL 250 MG TABS (TERBINAFINE HCL) One by mouth once daily for toenail fungus  #30 x 3   Entered and Authorized by:   Etta Grandchild MD   Signed by:   Etta Grandchild MD on 10/09/2009   Method used:   Electronically to        [facility]* (retail)       [address]       [address] Shipping/mailing       [place], [place]  [address]       Ph: [phone]       Fax: [phone]   RxID:   [id number]

## 2010-06-30 NOTE — Progress Notes (Signed)
Summary: CALL   Phone Note Call from Patient Call back at Home Phone [phone]   Summary of Call: Pt left vm that he lost rx that MD gave him. Unsure of what medication.  Initial call taken by: Lamar Sprinkles, CMA,  February 24, 2010 11:01 AM  Follow-up for Phone Call        LMOVM for pt to call back with rx infor/name.Marland KitchenMarland KitchenAlvy Beal Archie CMA  February 24, 2010 2:08 PM  spoke with pt who clarified rx/ refill sent to pharmacy Follow-up by: Rock Nephew CMA,  February 24, 2010 2:13 PM

## 2010-06-30 NOTE — Letter (Signed)
Summary: [facility] Consult Scheduled Letter  [facility]  [address] [place], [place] [address]   Phone: [phone]  Fax: [phone]      04/13/2010 MRN: [id number]  [facility] [address] [place], [place]  [address]    Dear Mr. [facility],      We have scheduled an appointment for you.  At the recommendation of Dr.Jones, we have scheduled you a consult with Dr Clarisse Gouge on 05/19/10 at 2:25pm.  Their phone number is [phone].  If this appointment day and time is not convenient for you, please feel free to call the office of the doctor you are being referred to at the number listed above and reschedule the appointment.    Dr Rubye Beach [address] [place], [place] [address]   Thank you,  Patient Care Coordinator [facility]

## 2010-06-30 NOTE — Letter (Signed)
Summary: Results Follow-up Letter  [facility]  [address] [place], [place] [address]   Phone: [phone]  Fax: [phone]    10/09/2009  [address] [place], [place]  [address]  Dear Mr. Rubinstein,   The following are the results of your recent test(s):  Test     Result     CBC       normal Liver/kidney   normal Thyroid     normal   _________________________________________________________  Please call for an appointment as directed _________________________________________________________ _________________________________________________________ _________________________________________________________  Sincerely,  Sanda Linger MD [facility]

## 2010-06-30 NOTE — Letter (Signed)
Summary: Lipid Letter  [facility]  [address] [place], [place] [address]   Phone: [phone]  Fax: [phone]    10/09/2009  [facility] [address] [place], [place]  [address]  Dear [name] [name]:  We have carefully reviewed your last lipid profile from  and the results are noted below with a summary of recommendations for lipid management.    Cholesterol:       187     Goal: <200   HDL "good" Cholesterol:   69.62     Goal: >40   LDL "bad" Cholesterol:   109     Goal: <130   Triglycerides:       255.0     Goal: <150    the triglycerides are too high!!!    TLC Diet (Therapeutic Lifestyle Change): Saturated Fats & Transfatty acids should be kept < 7% of total calories ***Reduce Saturated Fats Polyunstaurated Fat can be up to 10% of total calories Monounsaturated Fat Fat can be up to 20% of total calories Total Fat should be no greater than 25-35% of total calories Carbohydrates should be 50-60% of total calories Protein should be approximately 15% of total calories Fiber should be at least 20-30 grams a day ***Increased fiber may help lower LDL Total Cholesterol should be < 200mg /day Consider adding plant stanol/sterols to diet (example: Benacol spread) ***A higher intake of unsaturated fat may reduce Triglycerides and Increase HDL    Adjunctive Measures (may lower LIPIDS and reduce risk of Heart Attack) include: Aerobic Exercise (20-30 minutes 3-4 times a week) Limit Alcohol Consumption Weight Reduction Aspirin 75-81 mg a day by mouth (if not allergic or contraindicated) Dietary Fiber 20-30 grams a day by mouth     Current Medications: 1)    Terbinafine Hcl 250 Mg Tabs (Terbinafine hcl) .... One by mouth once daily for toenail fungus 2)    Loprox 0.77 % Gel (Ciclopirox) .... Apply to feet two times a day for 14 days for foot fungus.  If you have any questions, please call. We appreciate being able to work with you.   Sincerely,    [facility] Etta Grandchild MD

## 2010-07-02 ENCOUNTER — Encounter: Payer: Self-pay | Admitting: Internal Medicine

## 2010-07-02 NOTE — Consult Note (Signed)
Summary: [facility]  [facility]   Imported By: Lester Galena 05/29/2010 07:21:54  _____________________________________________________________________  External Attachment:    Type:   Image     Comment:   External Document

## 2010-07-02 NOTE — Assessment & Plan Note (Signed)
Summary: 2 MO ROV /NWS  #   Vital Signs:  Patient profile:   40 year old male Height:      70 inches Weight:      249.75 pounds BMI:     35.96 O2 Sat:      97 % on Room air Temp:     98.5 degrees F oral Pulse rate:   68 / minute Pulse rhythm:   regular Resp:     16 per minute BP sitting:   142 / 80  (left arm) Cuff size:   large  Vitals Entered By: Rock Nephew CMA (June 26, 2010 10:00 AM)  Nutrition Counseling: Patient's BMI is greater than 25 and therefore counseled on weight management options.  O2 Flow:  Room air  Primary Care Provider:  Etta Grandchild MD   History of Present Illness: He returns for f/up and tells me that his headaches are much, much better on Topiramate and much less frequent. He still has low back pain and wants to see if a steroid shot would help. He is tolerating terbinafine and he feels like his toenails are growing out normally. He is working on lifestyle modifications to lower his blood pressure.  Current Medications (verified): 1)  Terbinafine Hcl 250 Mg Tabs (Terbinafine Hcl) .... One By Mouth Once Daily For Toenail Fungus 2)  Tramadol Hcl 50 Mg Tabs (Tramadol Hcl) .... One By Mouth Qid As Needed For Low Back Pain 3)  Topiramate 25 Mg Tabs (Topiramate) .... Take 1 Tab By Mouth At Bedtime  Allergies (verified): No Known Drug Allergies  Past History:  Past Medical History: Last updated: 09/15/2009 Gallstones ----> hepatitis/pancreatitis in 2009 [facility]------------> headaches Nephrolithiasis, hx of  Past Surgical History: Last updated: 09/15/2009 Appendectomy Cholecystectomy Exploratory laparotomy  Family History: Last updated: 09/15/2009 Family History of Arthritis Family History Diabetes 1st degree relative  Social History: Last updated: 04/13/2010 Occupation: [employer] Married Never Smoked Alcohol use-no Drug use-no Regular exercise-yes  Risk Factors: Alcohol Use: 0 (04/13/2010) Exercise: yes (09/15/2009)  Risk  Factors: Smoking Status: never (04/13/2010)  Family History: Reviewed history from 09/15/2009 and no changes required. Family History of Arthritis Family History Diabetes 1st degree relative  Social History: Reviewed history from 04/13/2010 and no changes required. Occupation: [employer] Married Never Smoked Alcohol use-no Drug use-no Regular exercise-yes  Review of Systems  The patient denies anorexia, fever, weight loss, weight gain, chest pain, syncope, dyspnea on exertion, peripheral edema, prolonged cough, headaches, hemoptysis, abdominal pain, hematuria, muscle weakness, suspicious skin lesions, difficulty walking, depression, abnormal bleeding, enlarged lymph nodes, and angioedema.   GI:  Denies abdominal pain, change in bowel habits, dark tarry stools, indigestion, loss of appetite, nausea, vomiting, and yellowish skin color. MS:  Complains of low back pain; denies joint pain, joint redness, joint swelling, loss of strength, muscle aches, muscle weakness, and thoracic pain.  Physical Exam  General:  alert, well-developed, well-nourished, well-hydrated, and overweight-appearing.   Head:  normocephalic and atraumatic.   Mouth:  Oral mucosa and oropharynx without lesions or exudates.  Teeth in good repair. Neck:  supple, full ROM, no masses, no thyromegaly, no thyroid nodules or tenderness, no JVD, normal carotid upstroke, no carotid bruits, no cervical lymphadenopathy, and no neck tenderness.   Lungs:  normal respiratory effort, no intercostal retractions, no accessory muscle use, normal breath sounds, no dullness, no fremitus, no crackles, and no wheezes.   Heart:  normal rate, regular rhythm, no murmur, no gallop, no rub, and no JVD.   Abdomen:  soft, non-tender, normal bowel sounds, no distention, no masses, no guarding, no rigidity, no rebound tenderness, no abdominal hernia, no inguinal hernia, no hepatomegaly, no splenomegaly, and abdominal scar(s).   Msk:  No deformity or  scoliosis noted of thoracic or lumbar spine.  his toenails are looking a lot better. Pulses:  R and L carotid,radial,femoral,dorsalis pedis and posterior tibial pulses are full and equal bilaterally Extremities:  No clubbing, cyanosis, edema, or deformity noted with normal full range of motion of all joints.   Neurologic:  No cranial nerve deficits noted. Station and gait are normal. Plantar reflexes are down-going bilaterally. DTRs are symmetrical throughout. Sensory, motor and coordinative functions appear intact. Skin:  turgor normal, color normal, no rashes, no suspicious lesions, no ecchymoses, no petechiae, no purpura, no ulcerations, and no edema.   Cervical Nodes:  no anterior cervical adenopathy and no posterior cervical adenopathy.   Axillary Nodes:  no R axillary adenopathy and no L axillary adenopathy.   Psych:  Cognition and judgment appear intact. Alert and cooperative with normal attention span and concentration. No apparent delusions, illusions, hallucinations   Impression & Recommendations:  Problem # 1:  BACK PAIN (ICD-724.5) Assessment Unchanged  His updated medication list for this problem includes:    Tramadol Hcl 50 Mg Tabs (Tramadol hcl) ..... One by mouth qid as needed for low back pain    Ibuprofen 800 Mg Tabs (Ibuprofen) ..... One by mouth three times a day with food as needed for low back pain  Orders: Pain Clinic Referral (Pain)  Problem # 2:  ONYCHOMYCOSIS, TOENAILS (ICD-110.1) Assessment: Improved  His updated medication list for this problem includes:    Terbinafine Hcl 250 Mg Tabs (Terbinafine hcl) ..... One by mouth once daily for toenail fungus  Problem # 3:  ELEVATED BP W/O HYPERTENSION (ICD-796.2) Assessment: Unchanged  BP today: 142/80 Prior BP: 142/90 (04/13/2010)  Labs Reviewed: Creat: 1.1 (10/09/2009) Chol: 187 (10/09/2009)   HDL: 35.80 (10/09/2009)   TG: 255.0 (10/09/2009)  Instructed in low sodium diet (DASH Handout) and behavior  modification.    Complete Medication List: 1)  Terbinafine Hcl 250 Mg Tabs (Terbinafine hcl) .... One by mouth once daily for toenail fungus 2)  Tramadol Hcl 50 Mg Tabs (Tramadol hcl) .... One by mouth qid as needed for low back pain 3)  Topiramate 25 Mg Tabs (Topiramate) .... Take 1 tab by mouth at bedtime 4)  Ibuprofen 800 Mg Tabs (Ibuprofen) .... One by mouth three times a day with food as needed for low back pain  Patient Instructions: 1)  Please schedule a follow-up appointment in 2 months. 2)  It is important that you exercise regularly at least 20 minutes 5 times a week. If you develop chest pain, have severe difficulty breathing, or feel very tired , stop exercising immediately and seek medical attention. 3)  You need to lose weight. Consider a lower calorie diet and regular exercise.  4)  Check your Blood Pressure regularly. If it is above 140/90: you should make an appointment. 5)  Take 650-1000mg  of Tylenol every 4-6 hours as needed for relief of pain or comfort of fever AVOID taking more than 4000mg   in a 24 hour period (can cause liver damage in higher doses). 6)  Most patients (90%) with low back pain will improve with time (2-6 weeks). Keep active but avoid activities that are painful. Apply moist heat and/or ice to lower back several times a day. Prescriptions: IBUPROFEN 800 MG TABS (IBUPROFEN) One by mouth  three times a day with food as needed for low back pain  #90 x 3   Entered and Authorized by:   Etta Grandchild MD   Signed by:   Etta Grandchild MD on 06/26/2010   Method used:   Electronically to        [facility]* (retail)       [address]       [address] Shipping/mailing       [place], [place]  [address]       Ph: [phone]       Fax: [phone]   RxID:   [id number] IBUPROFEN 800 MG TABS (IBUPROFEN) One by mouth three times a day with food as needed for low back pain  #90 x 3   Entered and Authorized by:   Etta Grandchild MD    Signed by:   Etta Grandchild MD on 06/26/2010   Method used:   Print then Give to Patient   RxID:   [id number]    Orders Added: 1)  Pain Clinic Referral [Pain] 2)  Est. Patient Level IV [[address]]

## 2010-07-06 ENCOUNTER — Encounter: Payer: 59 | Attending: Internal Medicine | Admitting: *Deleted

## 2010-07-06 DIAGNOSIS — Z713 Dietary counseling and surveillance: Secondary | ICD-10-CM | POA: Insufficient documentation

## 2010-07-06 DIAGNOSIS — Z724 Inappropriate diet and eating habits: Secondary | ICD-10-CM | POA: Insufficient documentation

## 2010-07-22 NOTE — Letter (Signed)
Summary: Rene Kocher MD  Rene Kocher MD   Imported By: Lester Rembert 07/14/2010 10:01:30  _____________________________________________________________________  External Attachment:    Type:   Image     Comment:   External Document

## 2010-09-10 ENCOUNTER — Ambulatory Visit: Payer: Self-pay | Admitting: *Deleted

## 2010-10-13 NOTE — Discharge Summary (Signed)
[name] [name], [name] [name]              ACCOUNT NO.:  [id number]   MEDICAL RECORD NO.:  [id number]          PATIENT TYPE:  INP   LOCATION:  5527                         FACILITY:  [facility]   PHYSICIAN:  [name] Overlie, MD       DATE OF BIRTH:  [birth year]   DATE OF ADMISSION:  05/11/2008  DATE OF DISCHARGE:  05/15/2008                               DISCHARGE SUMMARY   DISCHARGE DIAGNOSES:  1. Status post cholecystectomy with biliary pancreatitis,      cholelithiasis.  2. Leukocytosis secondary to pancreatitis.  3. Hypokalemia.  4. Dehydration.  5. Obesity.   SUBJECTIVE:  This is a 40 year old American male with no past medical  history who presented to the ER with a chief complaint of nausea,  vomiting, abdominal pain for the last 15 hours.  The patient denied  drinking any alcohol, denied any previous history of gallbladder  disease.  Upon further evaluation the patient was found to have a lipase  of 3123, and hypokalemia with a potassium of 3.3.  He was also found to  be clinically dehydrated, was admitted for further assessment.  CT scan  of the abdomen and pelvis showed evidence of acute pancreatitis and  enlargement of the pancreas with peripancreatic edema.  No definite  ductal dilatation was and ultrasound of the abdomen showed  cholelithiasis with multiple tiny dependence calculi noted in the  gallbladder.  Surgical consultation was obtained and [name] [name] was consulted.  The patient was thought to have biliary  pancreatitis secondary to cholelithiasis and laparoscopic  cholecystectomy was scheduled.  The patient was started on the Zosyn for  his acute pancreatitis.  Blood cultures remained negative to date.  The  patient was monitored closely for clinical improvement from his  pancreatitis.  His lipase had normalized to about 38 on December 14 and  25 on December 15.  He subsequently was taken to the OR and had a  successful laparoscopic cholecystectomy done.  He was  evaluated on the  day of discharge.  His diet was advanced to low-fat, lactose-free  mechanical soft diet which he tolerated well.  His Zosyn was  discontinued.  The patient is being discharged with the following  medications and the following followup plan.   FOLLOWUP:  1. Call surgeon if fever greater than 101.5, new increased belly pain,      redness or drainage from the wound, nausea, vomiting, diarrhea or      constipation.  2. Follow up with [name] [name] on December 22 at 2:30 p.m., phone      number is [phone].  3. Case management to establish a primary care [name] [name] as the patient      does not have one.   DISCHARGE MEDICATIONS:  1. Percocet 5/325 1-2 tablets p.o. q.4 h p.r.n. pain.  2. Ibuprofen 100 mg p.o. q.8 h p.r.n. pain.  3. Protonix 40 mg p.o. daily.  4. Phenergan 25 mg p.o. q.6 h p.o. nausea.      [name] Overlie, MD  Electronically Signed     NA/MEDQ  D:  05/15/2008  T:  05/15/2008  Job:  [id number]
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
## 2010-10-22 ENCOUNTER — Ambulatory Visit: Payer: Self-pay | Admitting: *Deleted

## 2011-03-05 LAB — COMPREHENSIVE METABOLIC PANEL
ALT: 171 U/L — ABNORMAL HIGH (ref 0–53)
ALT: 327 U/L — ABNORMAL HIGH (ref 0–53)
AST: 26 U/L (ref 0–37)
AST: 35 U/L (ref 0–37)
AST: 514 U/L — ABNORMAL HIGH (ref 0–37)
AST: 67 U/L — ABNORMAL HIGH (ref 0–37)
Albumin: 3.3 g/dL — ABNORMAL LOW (ref 3.5–5.2)
Albumin: 3.4 g/dL — ABNORMAL LOW (ref 3.5–5.2)
Albumin: 3.9 g/dL (ref 3.5–5.2)
Alkaline Phosphatase: 62 U/L (ref 39–117)
Alkaline Phosphatase: 63 U/L (ref 39–117)
Alkaline Phosphatase: 66 U/L (ref 39–117)
Alkaline Phosphatase: 78 U/L (ref 39–117)
BUN: 4 mg/dL — ABNORMAL LOW (ref 6–23)
BUN: 6 mg/dL (ref 6–23)
BUN: 8 mg/dL (ref 6–23)
BUN: 9 mg/dL (ref 6–23)
CO2: 24 mEq/L (ref 19–32)
CO2: 24 mEq/L (ref 19–32)
CO2: 25 mEq/L (ref 19–32)
Calcium: 8.2 mg/dL — ABNORMAL LOW (ref 8.4–10.5)
Calcium: 8.4 mg/dL (ref 8.4–10.5)
Chloride: 103 mEq/L (ref 96–112)
Chloride: 106 mEq/L (ref 96–112)
Chloride: 107 mEq/L (ref 96–112)
Chloride: 108 mEq/L (ref 96–112)
Creatinine, Ser: 0.91 mg/dL (ref 0.4–1.5)
Creatinine, Ser: 0.93 mg/dL (ref 0.4–1.5)
Creatinine, Ser: 0.93 mg/dL (ref 0.4–1.5)
GFR calc Af Amer: >60 mL/min (ref >60)
GFR calc Af Amer: >60 mL/min (ref >60)
GFR calc Af Amer: >60 mL/min (ref >60)
GFR calc Af Amer: >60 mL/min (ref >60)
GFR calc non Af Amer: >60 mL/min (ref >60)
GFR calc non Af Amer: >60 mL/min (ref >60)
GFR calc non Af Amer: >60 mL/min (ref >60)
Glucose, Bld: 137 mg/dL — ABNORMAL HIGH (ref 70–99)
Glucose, Bld: 91 mg/dL (ref 70–99)
Potassium: 3.4 mEq/L — ABNORMAL LOW (ref 3.5–5.1)
Potassium: 3.6 mEq/L (ref 3.5–5.1)
Potassium: 3.7 mEq/L (ref 3.5–5.1)
Potassium: 3.9 mEq/L (ref 3.5–5.1)
Sodium: 133 mEq/L — ABNORMAL LOW (ref 135–145)
Sodium: 137 mEq/L (ref 135–145)
Sodium: 138 mEq/L (ref 135–145)
Total Bilirubin: 0.8 mg/dL (ref 0.3–1.2)
Total Bilirubin: 1.1 mg/dL (ref 0.3–1.2)
Total Bilirubin: 1.3 mg/dL — ABNORMAL HIGH (ref 0.3–1.2)
Total Bilirubin: 1.7 mg/dL — ABNORMAL HIGH (ref 0.3–1.2)
Total Protein: 6.3 g/dL (ref 6.0–8.3)
Total Protein: 6.7 g/dL (ref 6.0–8.3)
Total Protein: 6.8 g/dL (ref 6.0–8.3)

## 2011-03-05 LAB — BLOOD GAS, ARTERIAL
Acid-Base Excess: 0.9 mmol/L (ref 0.0–2.0)
Acid-Base Excess: 1.7 mmol/L (ref 0.0–2.0)
Bicarbonate: 25.4 mEq/L — ABNORMAL HIGH (ref 20.0–24.0)
Bicarbonate: 26.5 mEq/L — ABNORMAL HIGH (ref 20.0–24.0)
Drawn by: 310041
FIO2: 0.28 %
FIO2: 0.28 %
O2 Saturation: 77.8 %
O2 Saturation: 97.3 %
Patient temperature: 98.6
Patient temperature: 99.1
TCO2: 26.7 mmol/L (ref 0–100)
TCO2: 27.9 mmol/L (ref 0–100)
pCO2 arterial: 43.5 mmHg (ref 35.0–45.0)
pCO2 arterial: 47.5 mmHg — ABNORMAL HIGH (ref 35.0–45.0)
pH, Arterial: 7.366 (ref 7.350–7.450)
pH, Arterial: 7.384 (ref 7.350–7.450)
pO2, Arterial: 44.3 mmHg — ABNORMAL LOW (ref 80.0–100.0)
pO2, Arterial: 99.6 mmHg (ref 80.0–100.0)

## 2011-03-05 LAB — LIPID PANEL
Cholesterol: 181 mg/dL (ref 0–200)
HDL: 28 mg/dL — ABNORMAL LOW (ref >39)
LDL Cholesterol: 130 mg/dL — ABNORMAL HIGH (ref 0–99)
Total CHOL/HDL Ratio: 6.5 RATIO
Triglycerides: 114 mg/dL (ref <150)
VLDL: 23 mg/dL (ref 0–40)

## 2011-03-05 LAB — CBC
HCT: 38.3 % — ABNORMAL LOW (ref 39.0–52.0)
HCT: 40.5 % (ref 39.0–52.0)
HCT: 45.5 % (ref 39.0–52.0)
Hemoglobin: 13.9 g/dL (ref 13.0–17.0)
Hemoglobin: 13.9 g/dL (ref 13.0–17.0)
Hemoglobin: 15.3 g/dL (ref 13.0–17.0)
MCHC: 33.7 g/dL (ref 30.0–36.0)
MCHC: 34.1 g/dL (ref 30.0–36.0)
MCHC: 34.3 g/dL (ref 30.0–36.0)
MCV: 82.2 fL (ref 78.0–100.0)
MCV: 82.7 fL (ref 78.0–100.0)
MCV: 83.6 fL (ref 78.0–100.0)
Platelets: 295 10*3/uL (ref 150–400)
Platelets: 318 10*3/uL (ref 150–400)
Platelets: 331 10*3/uL (ref 150–400)
RBC: 4.65 MIL/uL (ref 4.22–5.81)
RBC: 4.9 MIL/uL (ref 4.22–5.81)
RBC: 4.91 MIL/uL (ref 4.22–5.81)
RBC: 5.44 MIL/uL (ref 4.22–5.81)
RDW: 13.4 % (ref 11.5–15.5)
RDW: 13.5 % (ref 11.5–15.5)
RDW: 13.6 % (ref 11.5–15.5)
RDW: 13.7 % (ref 11.5–15.5)
WBC: 13.2 10*3/uL — ABNORMAL HIGH (ref 4.0–10.5)
WBC: 13.2 10*3/uL — ABNORMAL HIGH (ref 4.0–10.5)
WBC: 13.3 10*3/uL — ABNORMAL HIGH (ref 4.0–10.5)
WBC: 13.3 10*3/uL — ABNORMAL HIGH (ref 4.0–10.5)

## 2011-03-05 LAB — DIFFERENTIAL
Basophils Absolute: 0 10*3/uL (ref 0.0–0.1)
Basophils Relative: 0 % (ref 0–1)
Eosinophils Absolute: 0 10*3/uL (ref 0.0–0.7)
Eosinophils Relative: 0 % (ref 0–5)
Lymphocytes Relative: 9 % — ABNORMAL LOW (ref 12–46)
Lymphs Abs: 1.2 10*3/uL (ref 0.7–4.0)
Monocytes Absolute: 1 10*3/uL (ref 0.1–1.0)
Monocytes Relative: 8 % (ref 3–12)
Neutro Abs: 11 10*3/uL — ABNORMAL HIGH (ref 1.7–7.7)
Neutrophils Relative %: 83 % — ABNORMAL HIGH (ref 43–77)

## 2011-03-05 LAB — CULTURE, BLOOD (ROUTINE X 2): Culture: NO GROWTH

## 2011-03-05 LAB — GLUCOSE, CAPILLARY
Glucose-Capillary: 111 mg/dL — ABNORMAL HIGH (ref 70–99)
Glucose-Capillary: 97 mg/dL (ref 70–99)

## 2011-03-05 LAB — URINALYSIS, ROUTINE W REFLEX MICROSCOPIC
Bilirubin Urine: NEGATIVE
Glucose, UA: NEGATIVE mg/dL
Hgb urine dipstick: NEGATIVE
Ketones, ur: NEGATIVE mg/dL
Nitrite: NEGATIVE
Protein, ur: NEGATIVE mg/dL
Specific Gravity, Urine: 1.016 (ref 1.005–1.030)
Urobilinogen, UA: 2 mg/dL — ABNORMAL HIGH (ref 0.0–1.0)
pH: 7 (ref 5.0–8.0)

## 2011-03-05 LAB — B-NATRIURETIC PEPTIDE (CONVERTED LAB): Pro B Natriuretic peptide (BNP): <30 pg/mL (ref 0.0–100.0)

## 2011-03-05 LAB — URINE CULTURE: Culture: NO GROWTH

## 2011-03-05 LAB — D-DIMER, QUANTITATIVE: D-Dimer, Quant: 2.6 ug/mL-FEU — ABNORMAL HIGH (ref 0.00–0.48)

## 2011-03-05 LAB — AMYLASE: Amylase: 140 U/L — ABNORMAL HIGH (ref 27–131)

## 2011-03-05 LAB — CLOSTRIDIUM DIFFICILE EIA: C difficile Toxins A+B, EIA: NEGATIVE

## 2011-03-05 LAB — LIPASE, BLOOD
Lipase: 155 U/L — ABNORMAL HIGH (ref 11–59)
Lipase: 3123 U/L — ABNORMAL HIGH (ref 11–59)

## 2011-03-05 LAB — HEMOGLOBIN A1C
Hgb A1c MFr Bld: 5.9 % (ref 4.6–6.1)
Mean Plasma Glucose: 123 mg/dL

## 2011-06-28 ENCOUNTER — Encounter: Payer: Self-pay | Admitting: Internal Medicine

## 2011-06-28 ENCOUNTER — Telehealth: Payer: Self-pay | Admitting: *Deleted

## 2011-06-28 ENCOUNTER — Encounter: Payer: Self-pay | Admitting: *Deleted

## 2011-06-28 ENCOUNTER — Ambulatory Visit (INDEPENDENT_AMBULATORY_CARE_PROVIDER_SITE_OTHER): Payer: 59 | Admitting: Internal Medicine

## 2011-06-28 VITALS — BP 140/90 | HR 80 | Temp 98.1°F | Resp 16 | Ht 70.0 in | Wt 258.0 lb

## 2011-06-28 DIAGNOSIS — R030 Elevated blood-pressure reading, without diagnosis of hypertension: Secondary | ICD-10-CM

## 2011-06-28 DIAGNOSIS — J019 Acute sinusitis, unspecified: Secondary | ICD-10-CM

## 2011-06-28 DIAGNOSIS — M549 Dorsalgia, unspecified: Secondary | ICD-10-CM

## 2011-06-28 DIAGNOSIS — E669 Obesity, unspecified: Secondary | ICD-10-CM

## 2011-06-28 MED ORDER — AZITHROMYCIN 500 MG PO TABS: 500.0000 mg | ORAL_TABLET | Freq: Every day | ORAL | Status: AC

## 2011-06-28 MED ORDER — PHENTERMINE HCL 37.5 MG PO TABS: 37.5000 mg | ORAL_TABLET | Freq: Every day | ORAL | Status: AC

## 2011-06-28 MED ORDER — GUAIFENESIN-CODEINE 100-10 MG/5ML PO SYRP: 5.0000 mL | ORAL_SOLUTION | Freq: Three times a day (TID) | ORAL | Status: AC | PRN

## 2011-06-28 MED ORDER — IBUPROFEN 800 MG PO TABS: 800.0000 mg | ORAL_TABLET | Freq: Three times a day (TID) | ORAL | Status: DC | PRN

## 2011-06-28 NOTE — Assessment & Plan Note (Signed)
As he starts adipex-p for a weight loss program I will bring him back in for a repeat BP

## 2011-06-28 NOTE — Assessment & Plan Note (Signed)
He will try adipex-p

## 2011-06-28 NOTE — Assessment & Plan Note (Signed)
Start zpak for the infection and a cough suppressant 

## 2011-06-28 NOTE — Progress Notes (Signed)
Subjective:    Patient ID: [name] [name], male    DOB: 09/23/1970, 41 y.o.   MRN: [id number]  Sinusitis This is a new problem. The current episode started in the past 7 days. The problem has been gradually worsening since onset. There has been no fever. His pain is at a severity of 0/10. He is experiencing no pain. Associated symptoms include chills, congestion, coughing, sinus pressure and a sore throat. Pertinent negatives include no diaphoresis, ear pain, headaches, hoarse voice, neck pain, shortness of breath, sneezing or swollen glands. Past treatments include nothing.      Review of Systems  Constitutional: Positive for chills. Negative for fever, diaphoresis, activity change, appetite change, fatigue and unexpected weight change.  HENT: Positive for congestion, sore throat, rhinorrhea, postnasal drip and sinus pressure. Negative for ear pain, hoarse voice, facial swelling, sneezing, trouble swallowing, neck pain, neck stiffness, dental problem, voice change and ear discharge.   Eyes: Negative.   Respiratory: Positive for cough. Negative for choking, chest tightness, shortness of breath, wheezing and stridor.   Cardiovascular: Negative for chest pain, palpitations and leg swelling.  Gastrointestinal: Negative for nausea, vomiting, abdominal pain, diarrhea, constipation, blood in stool and abdominal distention.  Genitourinary: Negative for dysuria, urgency, frequency, hematuria, flank pain, decreased urine volume, enuresis and difficulty urinating.  Musculoskeletal: Positive for back pain (chronic, unchanged). Negative for myalgias, joint swelling, arthralgias and gait problem.  Skin: Negative for color change, pallor, rash and wound.  Neurological: Negative for dizziness, tremors, seizures, syncope, facial asymmetry, speech difficulty, weakness, light-headedness, numbness and headaches.  Hematological: Negative for adenopathy. Does not bruise/bleed easily.  Psychiatric/Behavioral:  Negative.        Objective:   Physical Exam  Vitals reviewed. Constitutional: He is oriented to person, place, and time. He appears well-developed and well-nourished. No distress.  HENT:  Head: Normocephalic and atraumatic. No trismus in the jaw.  Right Ear: Hearing, tympanic membrane, external ear and ear canal normal.  Left Ear: Hearing, tympanic membrane, external ear and ear canal normal.  Nose: Mucosal edema and rhinorrhea present. No nose lacerations, sinus tenderness, nasal deformity, septal deviation or nasal septal hematoma. No epistaxis.  No foreign bodies. Right sinus exhibits maxillary sinus tenderness. Right sinus exhibits no frontal sinus tenderness. Left sinus exhibits maxillary sinus tenderness. Left sinus exhibits no frontal sinus tenderness.  Mouth/Throat: Oropharynx is clear and moist and mucous membranes are normal. Mucous membranes are not pale, not dry and not cyanotic. No uvula swelling. No oropharyngeal exudate, posterior oropharyngeal edema, posterior oropharyngeal erythema or tonsillar abscesses.  Eyes: Conjunctivae are normal. Right eye exhibits no discharge. Left eye exhibits no discharge. No scleral icterus.  Neck: Normal range of motion. Neck supple. No JVD present. No tracheal deviation present. No thyromegaly present.  Cardiovascular: Normal rate, regular rhythm, normal heart sounds and intact distal pulses.  Exam reveals no gallop and no friction rub.   No murmur heard. Pulmonary/Chest: Effort normal and breath sounds normal. No stridor. No respiratory distress. He has no wheezes. He has no rales. He exhibits no tenderness.  Abdominal: Soft. Bowel sounds are normal. He exhibits no distension and no mass. There is no tenderness. There is no rebound and no guarding.  Musculoskeletal: Normal range of motion. He exhibits no edema and no tenderness.  Lymphadenopathy:    He has no cervical adenopathy.  Neurological: He is oriented to person, place, and time.  Skin:  Skin is warm and dry. No rash noted. He is not diaphoretic. No erythema. No  pallor.  Psychiatric: He has a normal mood and affect. His behavior is normal. Judgment and thought content normal.      Lab Results  Component Value Date   WBC 6.8 10/09/2009   HGB 15.5 10/09/2009   HCT 44.9 10/09/2009   PLT 329.0 10/09/2009   GLUCOSE 82 10/09/2009   CHOL 187 10/09/2009   TRIG 255.0* 10/09/2009   HDL 35.80* 10/09/2009   LDLDIRECT 108.7 10/09/2009   LDLCALC  Value: 130        Total Cholesterol/HDL:CHD Risk Coronary Heart Disease Risk Table                     Men   Women  1/2 Average Risk   3.4   3.3* 05/10/2008   ALT 35 10/09/2009   AST 36 10/09/2009   NA 142 10/09/2009   K 4.1 10/09/2009   CL 104 10/09/2009   CREATININE 1.1 10/09/2009   BUN 9 10/09/2009   CO2 32 10/09/2009   TSH 2.06 10/09/2009   HGBA1C  Value: 5.9 (NOTE)   The ADA recommends the following therapeutic goal for glycemic   control related to Hgb A1C measurement:   Goal of Therapy:   < 7.0% Hgb A1C   Reference: American Diabetes Association: Clinical Practice   Recommendations 2008, Diabetes Care,  2008, 31:(Suppl 1). 05/11/2008      Assessment & Plan:

## 2011-06-28 NOTE — Patient Instructions (Signed)
Sinusitis Sinuses are air pockets within the bones of your face. The growth of bacteria within a sinus leads to infection. The infection prevents the sinuses from draining. This infection is called sinusitis. SYMPTOMS  There will be different areas of pain depending on which sinuses have become infected.  The maxillary sinuses often produce pain beneath the eyes.   Frontal sinusitis may cause pain in the middle of the forehead and above the eyes.  Other problems (symptoms) include:  Toothaches.   Colored, pus-like (purulent) drainage from the nose.   Swelling, warmth, and tenderness over the sinus areas may be signs of infection.  TREATMENT  Sinusitis is most often determined by an exam.X-rays may be taken. If x-rays have been taken, make sure you obtain your results or find out how you are to obtain them. Your caregiver may give you medications (antibiotics). These are medications that will help kill the bacteria causing the infection. You may also be given a medication (decongestant) that helps to reduce sinus swelling.  HOME CARE INSTRUCTIONS   Only take over-the-counter or prescription medicines for pain, discomfort, or fever as directed by your caregiver.   Drink extra fluids. Fluids help thin the mucus so your sinuses can drain more easily.   Applying either moist heat or ice packs to the sinus areas may help relieve discomfort.   Use saline nasal sprays to help moisten your sinuses. The sprays can be found at your local drugstore.  SEEK IMMEDIATE MEDICAL CARE IF:  You have a fever.   You have increasing pain, severe headaches, or toothache.   You have nausea, vomiting, or drowsiness.   You develop unusual swelling around the face or trouble seeing.  MAKE SURE YOU:   Understand these instructions.   Will watch your condition.   Will get help right away if you are not doing well or get worse.  Document Released: 05/17/2005 Document Revised: 01/27/2011 Document Reviewed:  12/14/2006 [facility] Patient Information 2012 [place], [place].Obesity Obesity is defined as having a body mass index (BMI) of 30 or more. To calculate your BMI divide your weight in pounds by your height in inches squared and multiply that product by 703. Major illnesses resulting from long-term obesity include:  Stroke.   Heart disease.   Diabetes.   Many cancers.   Arthritis.  Obesity also complicates recovery from many other medical problems.  CAUSES   A history of obesity in your parents.   Thyroid hormone imbalance.   Environmental factors such as excess calorie intake and physical inactivity.  TREATMENT  A healthy weight loss program includes:  A calorie restricted diet based on individual calorie needs.   Increased physical activity (exercise).  An exercise program is just as important as the right low-calorie diet.  Weight-loss medicines should be used only under the supervision of your physician. These medicines help, but only if they are used with diet and exercise programs. Medicines can have side effects including nervousness, nausea, abdominal pain, diarrhea, headache, drowsiness, and depression.  An unhealthy weight loss program includes:  Fasting.   Fad diets.   Supplements and drugs.  These choices do not succeed in long-term weight control.  HOME CARE INSTRUCTIONS  To help you make the needed dietary changes:   Exercise and perform physical activity as directed by your caregiver.   Keep a daily record of everything you eat. There are many free websites to help you with this. It may be helpful to measure your foods so you can determine  if you are eating the correct portion sizes.   Use low-calorie cookbooks or take special cooking classes.   Avoid alcohol. Drink more water and drinks with no calories.   Take vitamins and supplements only as recommended by your caregiver.   Weight loss support groups, Registered Dieticians, counselors, and stress  reduction education can also be very helpful.  Document Released: 06/24/2004 Document Revised: 01/27/2011 Document Reviewed: 04/23/2007 [facility] Patient Information 2012 [place], [place].

## 2011-06-28 NOTE — Telephone Encounter (Signed)
Same Day [facility]st[facility]. 

## 2011-06-28 NOTE — Assessment & Plan Note (Signed)
Continue ibuprofen as needed.

## 2011-07-23 ENCOUNTER — Ambulatory Visit: Payer: 59 | Admitting: Internal Medicine

## 2011-07-23 DIAGNOSIS — Z0289 Encounter for other administrative examinations: Secondary | ICD-10-CM

## 2011-07-30 ENCOUNTER — Ambulatory Visit (INDEPENDENT_AMBULATORY_CARE_PROVIDER_SITE_OTHER): Payer: 59 | Admitting: Internal Medicine

## 2011-07-30 ENCOUNTER — Encounter: Payer: Self-pay | Admitting: Internal Medicine

## 2011-07-30 ENCOUNTER — Telehealth: Payer: Self-pay

## 2011-07-30 NOTE — Telephone Encounter (Signed)
Patient was here in office for appt but received call to come in to work and had to leave. He has rescheduled his appt but need refills on his phentermine

## 2011-07-31 NOTE — Progress Notes (Signed)
  Subjective:    Patient ID: [name] [name], male    DOB: [birth year], 41 y.o.   MRN: [id number]  HPI  He was not seen, he left  Review of Systems     Objective:   Physical Exam        Assessment & Plan:

## 2011-08-02 NOTE — Telephone Encounter (Signed)
Pt was here on time for his last OV. Per Alvy Beal A. Appt prior to his ran over and he had to leave. He is scheduled  Monday 08/09/11.

## 2011-08-02 NOTE — Telephone Encounter (Signed)
Pt left another vm requesting Rf again.

## 2011-08-02 NOTE — Telephone Encounter (Signed)
No, no refill

## 2011-08-04 ENCOUNTER — Telehealth: Payer: Self-pay | Admitting: Internal Medicine

## 2011-08-06 NOTE — Telephone Encounter (Signed)
Note made in error

## 2011-08-09 ENCOUNTER — Ambulatory Visit: Payer: 59 | Admitting: Internal Medicine

## 2011-08-09 DIAGNOSIS — Z0289 Encounter for other administrative examinations: Secondary | ICD-10-CM

## 2011-08-13 ENCOUNTER — Ambulatory Visit (INDEPENDENT_AMBULATORY_CARE_PROVIDER_SITE_OTHER): Payer: 59 | Admitting: Internal Medicine

## 2011-08-13 ENCOUNTER — Encounter: Payer: Self-pay | Admitting: Internal Medicine

## 2011-08-13 ENCOUNTER — Other Ambulatory Visit (INDEPENDENT_AMBULATORY_CARE_PROVIDER_SITE_OTHER): Payer: 59

## 2011-08-13 VITALS — BP 132/98 | HR 70 | Temp 97.1°F | Resp 16 | Wt 252.0 lb

## 2011-08-13 DIAGNOSIS — E782 Mixed hyperlipidemia: Secondary | ICD-10-CM

## 2011-08-13 DIAGNOSIS — I10 Essential (primary) hypertension: Secondary | ICD-10-CM

## 2011-08-13 DIAGNOSIS — E669 Obesity, unspecified: Secondary | ICD-10-CM

## 2011-08-13 LAB — URINALYSIS, ROUTINE W REFLEX MICROSCOPIC
Leukocytes, UA: NEGATIVE
Specific Gravity, Urine: 1.01 (ref 1.000–1.030)
Urobilinogen, UA: 0.2 (ref 0.0–1.0)
pH: 7.5 (ref 5.0–8.0)

## 2011-08-13 LAB — COMPREHENSIVE METABOLIC PANEL
ALT: 24 U/L (ref 0–53)
AST: 25 U/L (ref 0–37)
Alkaline Phosphatase: 58 U/L (ref 39–117)
Sodium: 139 mEq/L (ref 135–145)
Total Bilirubin: 0.4 mg/dL (ref 0.3–1.2)
Total Protein: 8 g/dL (ref 6.0–8.3)

## 2011-08-13 LAB — CBC WITH DIFFERENTIAL/PLATELET
Basophils Absolute: 0 10*3/uL (ref 0.0–0.1)
Eosinophils Relative: 1.5 % (ref 0.0–5.0)
HCT: 45.1 % (ref 39.0–52.0)
Lymphs Abs: 2.5 10*3/uL (ref 0.7–4.0)
MCHC: 33.3 g/dL (ref 30.0–36.0)
MCV: 83.8 fl (ref 78.0–100.0)
Monocytes Absolute: 0.6 10*3/uL (ref 0.1–1.0)
Platelets: 315 10*3/uL (ref 150.0–400.0)
RDW: 13.7 % (ref 11.5–14.6)

## 2011-08-13 LAB — TSH: TSH: 1.78 u[IU]/mL (ref 0.35–5.50)

## 2011-08-13 LAB — LIPID PANEL
Total CHOL/HDL Ratio: 4
Triglycerides: 123 mg/dL (ref 0.0–149.0)

## 2011-08-13 MED ORDER — AMLODIPINE BESYLATE-VALSARTAN 5-320 MG PO TABS: 1.0000 | ORAL_TABLET | Freq: Every day | ORAL | Status: DC

## 2011-08-13 NOTE — Assessment & Plan Note (Signed)
This has worsened despite taking adipex for several months

## 2011-08-13 NOTE — Patient Instructions (Signed)
Hypertriglyceridemia  Diet for High blood levels of Triglycerides Most fats in food are triglycerides. Triglycerides in your blood are stored as fat in your body. High levels of triglycerides in your blood may put you at a greater risk for heart disease and stroke.  Normal triglyceride levels are less than 150 mg/dL. Borderline high levels are 150-199 mg/dl. High levels are 200 - 499 mg/dL, and very high triglyceride levels are greater than 500 mg/dL. The decision to treat high triglycerides is generally based on the level. For people with borderline or high triglyceride levels, treatment includes weight loss and exercise. Drugs are recommended for people with very high triglyceride levels. Many people who need treatment for high triglyceride levels have metabolic syndrome. This syndrome is a collection of disorders that often include: insulin resistance, high blood pressure, blood clotting problems, high cholesterol and triglycerides. TESTING PROCEDURE FOR TRIGLYCERIDES  You should not eat 4 hours before getting your triglycerides measured. The normal range of triglycerides is between 10 and 250 milligrams per deciliter (mg/dl). Some people may have extreme levels (1000 or above), but your triglyceride level may be too high if it is above 150 mg/dl, depending on what other risk factors you have for heart disease.   People with high blood triglycerides may also have high blood cholesterol levels. If you have high blood cholesterol as well as high blood triglycerides, your risk for heart disease is probably greater than if you only had high triglycerides. High blood cholesterol is one of the main risk factors for heart disease.  CHANGING YOUR DIET  Your weight can affect your blood triglyceride level. If you are more than 20% above your ideal body weight, you may be able to lower your blood triglycerides by losing weight. Eating less and exercising regularly is the best way to combat this. Fat provides  more calories than any other food. The best way to lose weight is to eat less fat. Only 30% of your total calories should come from fat. Less than 7% of your diet should come from saturated fat. A diet low in fat and saturated fat is the same as a diet to decrease blood cholesterol. By eating a diet lower in fat, you may lose weight, lower your blood cholesterol, and lower your blood triglyceride level.  Eating a diet low in fat, especially saturated fat, may also help you lower your blood triglyceride level. Ask your dietitian to help you figure how much fat you can eat based on the number of calories your caregiver has prescribed for you.  Exercise, in addition to helping with weight loss may also help lower triglyceride levels.   Alcohol can increase blood triglycerides. You may need to stop drinking alcoholic beverages.   Too much carbohydrate in your diet may also increase your blood triglycerides. Some complex carbohydrates are necessary in your diet. These may include bread, rice, potatoes, other starchy vegetables and cereals.   Reduce "simple" carbohydrates. These may include pure sugars, candy, honey, and jelly without losing other nutrients. If you have the kind of high blood triglycerides that is affected by the amount of carbohydrates in your diet, you will need to eat less sugar and less high-sugar foods. Your caregiver can help you with this.   Adding 2-4 grams of fish oil (EPA+ DHA) may also help lower triglycerides. Speak with your caregiver before adding any supplements to your regimen.  Following the Diet  Maintain your ideal weight. Your caregivers can help you with a diet. Generally,   eating less food and getting more exercise will help you lose weight. Joining a weight control group may also help. Ask your caregivers for a good weight control group in your area.  Eat low-fat foods instead of high-fat foods. This can help you lose weight too.  These foods are lower in fat. Eat MORE  of these:   Dried beans, peas, and lentils.   Egg whites.   Low-fat cottage cheese.   Fish.   Lean cuts of meat, such as round, sirloin, rump, and flank (cut extra fat off meat you fix).   Whole grain breads, cereals and pasta.   Skim and nonfat dry milk.   Low-fat yogurt.   Poultry without the skin.   Cheese made with skim or part-skim milk, such as mozzarella, parmesan, farmers', ricotta, or pot cheese.  These are higher fat foods. Eat LESS of these:   Whole milk and foods made from whole milk, such as American, blue, cheddar, monterey jack, and swiss cheese   High-fat meats, such as luncheon meats, sausages, knockwurst, bratwurst, hot dogs, ribs, corned beef, ground pork, and regular ground beef.   Fried foods.  Limit saturated fats in your diet. Substituting unsaturated fat for saturated fat may decrease your blood triglyceride level. You will need to read package labels to know which products contain saturated fats.  These foods are high in saturated fat. Eat LESS of these:   Fried pork skins.   Whole milk.   Skin and fat from poultry.   Palm oil.   Butter.   Shortening.   Cream cheese.   Bacon.   Margarines and baked goods made from listed oils.   Vegetable shortenings.   Chitterlings.   Fat from meats.   Coconut oil.   Palm kernel oil.   Lard.   Cream.   Sour cream.   Fatback.   Coffee whiteners and non-dairy creamers made with these oils.   Cheese made from whole milk.  Use unsaturated fats (both polyunsaturated and monounsaturated) moderately. Remember, even though unsaturated fats are better than saturated fats; you still want a diet low in total fat.  These foods are high in unsaturated fat:   Canola oil.   Sunflower oil.   Mayonnaise.   Almonds.   Peanuts.   Pine nuts.   Margarines made with these oils.   Safflower oil.   Olive oil.   Avocados.   Cashews.   Peanut butter.   Sunflower seeds.   Soybean oil.     Peanut oil.   Olives.   Pecans.   Walnuts.   Pumpkin seeds.  Avoid sugar and other high-sugar foods. This will decrease carbohydrates without decreasing other nutrients. Sugar in your food goes rapidly to your blood. When there is excess sugar in your blood, your liver may use it to make more triglycerides. Sugar also contains calories without other important nutrients.  Eat LESS of these:   Sugar, brown sugar, powdered sugar, jam, jelly, preserves, honey, syrup, molasses, pies, candy, cakes, cookies, frosting, pastries, colas, soft drinks, punches, fruit drinks, and regular gelatin.   Avoid alcohol. Alcohol, even more than sugar, may increase blood triglycerides. In addition, alcohol is high in calories and low in nutrients. Ask for sparkling water, or a diet soft drink instead of an alcoholic beverage.  Suggestions for planning and preparing meals   Bake, broil, grill or roast meats instead of frying.   Remove fat from meats and skin from poultry before cooking.   Add spices,   herbs, lemon juice or vinegar to vegetables instead of salt, rich sauces or gravies.   Use a non-stick skillet without fat or use no-stick sprays.   Cool and refrigerate stews and broth. Then remove the hardened fat floating on the surface before serving.   Refrigerate meat drippings and skim off fat to make low-fat gravies.   Serve more fish.   Use less butter, margarine and other high-fat spreads on bread or vegetables.   Use skim or reconstituted non-fat dry milk for cooking.   Cook with low-fat cheeses.   Substitute low-fat yogurt or cottage cheese for all or part of the sour cream in recipes for sauces, dips or congealed salads.   Use half yogurt/half mayonnaise in salad recipes.   Substitute evaporated skim milk for cream. Evaporated skim milk or reconstituted non-fat dry milk can be whipped and substituted for whipped cream in certain recipes.   Choose fresh fruits for dessert instead of  high-fat foods such as pies or cakes. Fruits are naturally low in fat.  When Dining Out   Order low-fat appetizers such as fruit or vegetable juice, pasta with vegetables or tomato sauce.   Select clear, rather than cream soups.   Ask that dressings and gravies be served on the side. Then use less of them.   Order foods that are baked, broiled, poached, steamed, stir-fried, or roasted.   Ask for margarine instead of butter, and use only a small amount.   Drink sparkling water, unsweetened tea or coffee, or diet soft drinks instead of alcohol or other sweet beverages.  QUESTIONS AND ANSWERS ABOUT OTHER FATS IN THE BLOOD: SATURATED FAT, TRANS FAT, AND CHOLESTEROL What is trans fat? Trans fat is a type of fat that is formed when vegetable oil is hardened through a process called hydrogenation. This process helps makes foods more solid, gives them shape, and prolongs their shelf life. Trans fats are also called hydrogenated or partially hydrogenated oils.  What do saturated fat, trans fat, and cholesterol in foods have to do with heart disease? Saturated fat, trans fat, and cholesterol in the diet all raise the level of LDL "bad" cholesterol in the blood. The higher the LDL cholesterol, the greater the risk for coronary heart disease (CHD). Saturated fat and trans fat raise LDL similarly.  What foods contain saturated fat, trans fat, and cholesterol? High amounts of saturated fat are found in animal products, such as fatty cuts of meat, chicken skin, and full-fat dairy products like butter, whole milk, cream, and cheese, and in tropical vegetable oils such as palm, palm kernel, and coconut oil. Trans fat is found in some of the same foods as saturated fat, such as vegetable shortening, some margarines (especially hard or stick margarine), crackers, cookies, baked goods, fried foods, salad dressings, and other processed foods made with partially hydrogenated vegetable oils. Small amounts of trans fat  also occur naturally in some animal products, such as milk products, beef, and lamb. Foods high in cholesterol include liver, other organ meats, egg yolks, shrimp, and full-fat dairy products. How can I use the new food label to make heart-healthy food choices? Check the Nutrition Facts panel of the food label. Choose foods lower in saturated fat, trans fat, and cholesterol. For saturated fat and cholesterol, you can also use the Percent Daily Value (%DV): 5% DV or less is low, and 20% DV or more is high. (There is no %DV for trans fat.) Use the Nutrition Facts panel to choose foods low in   saturated fat and cholesterol, and if the trans fat is not listed, read the ingredients and limit products that list shortening or hydrogenated or partially hydrogenated vegetable oil, which tend to be high in trans fat. POINTS TO REMEMBER: YOU NEED A LITTLE TLC (THERAPEUTIC LIFESTYLE CHANGES)  Discuss your risk for heart disease with your caregivers, and take steps to reduce risk factors.   Change your diet. Choose foods that are low in saturated fat, trans fat, and cholesterol.   Add exercise to your daily routine if it is not already being done. Participate in physical activity of moderate intensity, like brisk walking, for at least 30 minutes on most, and preferably all days of the week. No time? Break the 30 minutes into three, 10-minute segments during the day.   Stop smoking. If you do smoke, contact your caregiver to discuss ways in which they can help you quit.   Do not use street drugs.   Maintain a normal weight.   Maintain a healthy blood pressure.   Keep up with your blood work for checking the fats in your blood as directed by your caregiver.  Document Released: 03/04/2004 Document Revised: 05/06/2011 Document Reviewed: 09/30/2008 [employer] Patient Information 2012 [employer].Hypertension As your heart beats, it forces blood through your arteries. This force is your blood pressure. If the  pressure is too high, it is called hypertension (HTN) or high blood pressure. HTN is dangerous because you may have it and not know it. High blood pressure may mean that your heart has to work harder to pump blood. Your arteries may be narrow or stiff. The extra work puts you at risk for heart disease, stroke, and other problems.  Blood pressure consists of two numbers, a higher number over a lower, 110/72, for example. It is stated as "110 over 72." The ideal is below 120 for the top number (systolic) and under 80 for the bottom (diastolic). Write down your blood pressure today. You should pay close attention to your blood pressure if you have certain conditions such as:  Heart failure.   Prior heart attack.   Diabetes   Chronic kidney disease.   Prior stroke.   Multiple risk factors for heart disease.  To see if you have HTN, your blood pressure should be measured while you are seated with your arm held at the level of the heart. It should be measured at least twice. A one-time elevated blood pressure reading (especially in the Emergency Department) does not mean that you need treatment. There may be conditions in which the blood pressure is different between your right and left arms. It is important to see your caregiver soon for a recheck. Most people have essential hypertension which means that there is not a specific cause. This type of high blood pressure may be lowered by changing lifestyle factors such as:  Stress.   Smoking.   Lack of exercise.   Excessive weight.   Drug/tobacco/alcohol use.   Eating less salt.  Most people do not have symptoms from high blood pressure until it has caused damage to the body. Effective treatment can often prevent, delay or reduce that damage. TREATMENT  When a cause has been identified, treatment for high blood pressure is directed at the cause. There are a large number of medications to treat HTN. These fall into several categories, and your  caregiver will help you select the medicines that are best for you. Medications may have side effects. You should review side effects with   your caregiver. If your blood pressure stays high after you have made lifestyle changes or started on medicines,   Your medication(s) may need to be changed.   Other problems may need to be addressed.   Be certain you understand your prescriptions, and know how and when to take your medicine.   Be sure to follow up with your caregiver within the time frame advised (usually within two weeks) to have your blood pressure rechecked and to review your medications.   If you are taking more than one medicine to lower your blood pressure, make sure you know how and at what times they should be taken. Taking two medicines at the same time can result in blood pressure that is too low.  SEEK IMMEDIATE MEDICAL CARE IF:  You develop a severe headache, blurred or changing vision, or confusion.   You have unusual weakness or numbness, or a faint feeling.   You have severe chest or abdominal pain, vomiting, or breathing problems.  MAKE SURE YOU:   Understand these instructions.   Will watch your condition.   Will get help right away if you are not doing well or get worse.  Document Released: 05/17/2005 Document Revised: 05/06/2011 Document Reviewed: 01/05/2008 [employer] Patient Information 2012 [employer]. 

## 2011-08-13 NOTE — Assessment & Plan Note (Signed)
I will check his FLP today 

## 2011-08-13 NOTE — Assessment & Plan Note (Signed)
I will check his labs to look for secondary causes and have asked him to start exforge to control the BP and reduce his risk of CV event

## 2011-08-13 NOTE — Progress Notes (Signed)
Subjective:    Patient ID: [name] [name], male    DOB: 26-May-1971, 41 y.o.   MRN: [id number]  Hypertension This is a chronic problem. The current episode started more than 1 year ago. The problem has been gradually worsening since onset. The problem is uncontrolled. Pertinent negatives include no anxiety, blurred vision, chest pain, headaches, malaise/fatigue, neck pain, orthopnea, palpitations, peripheral edema, PND, shortness of breath or sweats. Past treatments include nothing. Compliance problems include exercise and diet.       Review of Systems  Constitutional: Positive for unexpected weight change (weight gain). Negative for fever, chills, malaise/fatigue, diaphoresis, activity change, appetite change and fatigue.  HENT: Negative.  Negative for neck pain.   Eyes: Negative.  Negative for blurred vision.  Respiratory: Negative for cough, chest tightness, shortness of breath, wheezing and stridor.   Cardiovascular: Negative for chest pain, palpitations, orthopnea, leg swelling and PND.  Gastrointestinal: Negative for nausea, vomiting, abdominal pain, diarrhea and constipation.  Genitourinary: Negative for dysuria, urgency, frequency, hematuria, flank pain, decreased urine volume, enuresis and difficulty urinating.  Musculoskeletal: Negative for myalgias, back pain, joint swelling, arthralgias and gait problem.  Skin: Negative for color change, pallor, rash and wound.  Neurological: Negative for dizziness, tremors, seizures, syncope, speech difficulty, weakness, light-headedness, numbness and headaches.  Hematological: Negative for adenopathy. Does not bruise/bleed easily.  Psychiatric/Behavioral: Negative.        Objective:   Physical Exam  Vitals reviewed. Constitutional: He is oriented to person, place, and time. He appears well-developed and well-nourished. No distress.  HENT:  Head: Normocephalic and atraumatic.  Mouth/Throat: Oropharynx is clear and moist. No oropharyngeal  exudate.  Eyes: Conjunctivae are normal. Right eye exhibits no discharge. Left eye exhibits no discharge. No scleral icterus.  Neck: Normal range of motion. Neck supple. No JVD present. No tracheal deviation present. No thyromegaly present.  Cardiovascular: Normal rate, regular rhythm, normal heart sounds and intact distal pulses.  Exam reveals no gallop and no friction rub.   No murmur heard. Pulmonary/Chest: Effort normal and breath sounds normal. No stridor. No respiratory distress. He has no wheezes. He has no rales. He exhibits no tenderness.  Abdominal: Soft. Bowel sounds are normal. He exhibits no distension and no mass. There is no tenderness. There is no rebound and no guarding.  Musculoskeletal: Normal range of motion. He exhibits no edema and no tenderness.  Lymphadenopathy:    He has no cervical adenopathy.  Neurological: He is oriented to person, place, and time.  Skin: Skin is warm and dry. No rash noted. He is not diaphoretic. No erythema. No pallor.  Psychiatric: He has a normal mood and affect. His behavior is normal. Judgment and thought content normal.      Lab Results  Component Value Date   WBC 6.8 10/09/2009   HGB 15.5 10/09/2009   HCT 44.9 10/09/2009   PLT 329.0 10/09/2009   GLUCOSE 82 10/09/2009   CHOL 187 10/09/2009   TRIG 255.0* 10/09/2009   HDL 35.80* 10/09/2009   LDLDIRECT 108.7 10/09/2009   LDLCALC  Value: 130        Total Cholesterol/HDL:CHD Risk Coronary Heart Disease Risk Table                     Men   Women  1/2 Average Risk   3.4   3.3* 05/10/2008   ALT 35 10/09/2009   AST 36 10/09/2009   NA 142 10/09/2009   K 4.1 10/09/2009   CL 104 10/09/2009  CREATININE 1.1 10/09/2009   BUN 9 10/09/2009   CO2 32 10/09/2009   TSH 2.06 10/09/2009   HGBA1C  Value: 5.9 (NOTE)   The ADA recommends the following therapeutic goal for glycemic   control related to Hgb A1C measurement:   Goal of Therapy:   < 7.0% Hgb A1C   Reference: American Diabetes Association: Clinical Practice    Recommendations 2008, Diabetes Care,  2008, 31:(Suppl 1). 05/11/2008      Assessment & Plan:

## 2011-10-22 ENCOUNTER — Ambulatory Visit: Payer: 59 | Admitting: Internal Medicine

## 2011-11-05 ENCOUNTER — Encounter: Payer: Self-pay | Admitting: Internal Medicine

## 2011-11-05 ENCOUNTER — Other Ambulatory Visit (INDEPENDENT_AMBULATORY_CARE_PROVIDER_SITE_OTHER): Payer: 59

## 2011-11-05 ENCOUNTER — Ambulatory Visit (INDEPENDENT_AMBULATORY_CARE_PROVIDER_SITE_OTHER): Payer: 59 | Admitting: Internal Medicine

## 2011-11-05 VITALS — BP 122/84 | HR 66 | Temp 97.6°F | Resp 16 | Ht 70.0 in | Wt 256.0 lb

## 2011-11-05 DIAGNOSIS — I10 Essential (primary) hypertension: Secondary | ICD-10-CM

## 2011-11-05 DIAGNOSIS — R7309 Other abnormal glucose: Secondary | ICD-10-CM

## 2011-11-05 DIAGNOSIS — R739 Hyperglycemia, unspecified: Secondary | ICD-10-CM | POA: Insufficient documentation

## 2011-11-05 DIAGNOSIS — E782 Mixed hyperlipidemia: Secondary | ICD-10-CM

## 2011-11-05 LAB — BASIC METABOLIC PANEL
Calcium: 8.7 mg/dL (ref 8.4–10.5)
GFR: 117.47 mL/min (ref >60.00)
Glucose, Bld: 86 mg/dL (ref 70–99)
Sodium: 138 mEq/L (ref 135–145)

## 2011-11-05 NOTE — Patient Instructions (Signed)

## 2011-11-05 NOTE — Assessment & Plan Note (Signed)
He has not taken any meds for 2 months but he has been working on his lifestyle modifications, his BP is well controlled today so I did not restart any meds, I will check his renal function today

## 2011-11-05 NOTE — Assessment & Plan Note (Signed)
I will check his a1c today to see if he has developed DM II 

## 2011-11-05 NOTE — Assessment & Plan Note (Signed)
At this time there is no indication for meds

## 2011-11-05 NOTE — Progress Notes (Signed)
Subjective:    Patient ID: [name] [name], male    DOB: Jul 30, 1970, 41 y.o.   MRN: [id number]  Hypertension This is a chronic problem. The current episode started more than 1 year ago. The problem has been gradually improving since onset. The problem is controlled. Pertinent negatives include no anxiety, blurred vision, chest pain, headaches, malaise/fatigue, neck pain, orthopnea, palpitations, peripheral edema, PND, shortness of breath or sweats. Past treatments include nothing. The current treatment provides moderate improvement. Compliance problems include psychosocial issues.       Review of Systems  Constitutional: Negative for fever, chills, malaise/fatigue, diaphoresis, activity change, appetite change, fatigue and unexpected weight change.  HENT: Negative.  Negative for neck pain.   Eyes: Negative.  Negative for blurred vision.  Respiratory: Negative for apnea, cough, choking, shortness of breath, wheezing and stridor.   Cardiovascular: Negative for chest pain, palpitations, orthopnea, leg swelling and PND.  Gastrointestinal: Negative for nausea, vomiting, abdominal pain, diarrhea, constipation and anal bleeding.  Genitourinary: Negative.   Musculoskeletal: Negative for myalgias, back pain, joint swelling, arthralgias and gait problem.  Skin: Negative for color change, pallor, rash and wound.  Neurological: Negative for dizziness, tremors, seizures, syncope, facial asymmetry, speech difficulty, weakness, light-headedness, numbness and headaches.  Hematological: Negative for adenopathy. Does not bruise/bleed easily.  Psychiatric/Behavioral: Negative.        Objective:   Physical Exam  Vitals reviewed. Constitutional: He is oriented to person, place, and time. He appears well-developed and well-nourished. No distress.  HENT:  Head: Normocephalic and atraumatic.  Mouth/Throat: Oropharynx is clear and moist. No oropharyngeal exudate.  Eyes: Conjunctivae are normal. Right eye  exhibits no discharge. Left eye exhibits no discharge. No scleral icterus.  Neck: Normal range of motion. Neck supple. No JVD present. No tracheal deviation present. No thyromegaly present.  Cardiovascular: Normal rate, regular rhythm, normal heart sounds and intact distal pulses.  Exam reveals no gallop and no friction rub.   No murmur heard. Pulmonary/Chest: Effort normal and breath sounds normal. No stridor. No respiratory distress. He has no wheezes. He has no rales. He exhibits no tenderness.  Abdominal: Soft. Bowel sounds are normal. He exhibits no distension and no mass. There is no tenderness. There is no rebound and no guarding.  Musculoskeletal: Normal range of motion. He exhibits no edema and no tenderness.  Lymphadenopathy:    He has no cervical adenopathy.  Neurological: He is oriented to person, place, and time.  Skin: Skin is warm and dry. No rash noted. He is not diaphoretic. No erythema. No pallor.  Psychiatric: He has a normal mood and affect. His behavior is normal. Judgment and thought content normal.      Lab Results  Component Value Date   WBC 8.1 08/13/2011   HGB 15.0 08/13/2011   HCT 45.1 08/13/2011   PLT 315.0 08/13/2011   GLUCOSE 110* 08/13/2011   CHOL 181 08/13/2011   TRIG 123.0 08/13/2011   HDL 40.60 08/13/2011   LDLDIRECT 108.7 10/09/2009   LDLCALC 116* 08/13/2011   ALT 24 08/13/2011   AST 25 08/13/2011   NA 139 08/13/2011   K 3.9 08/13/2011   CL 100 08/13/2011   CREATININE 1.0 08/13/2011   BUN 9 08/13/2011   CO2 31 08/13/2011   TSH 1.78 08/13/2011   HGBA1C  Value: 5.9 (NOTE)   The ADA recommends the following therapeutic goal for glycemic   control related to Hgb A1C measurement:   Goal of Therapy:   < 7.0% Hgb A1C  Reference: American Diabetes Association: Clinical Practice   Recommendations 2008, Diabetes Care,  2008, 31:(Suppl 1). 05/11/2008      Assessment & Plan:

## 2011-12-28 ENCOUNTER — Other Ambulatory Visit: Payer: Self-pay | Admitting: Otolaryngology

## 2011-12-28 DIAGNOSIS — H905 Unspecified sensorineural hearing loss: Secondary | ICD-10-CM

## 2012-01-01 ENCOUNTER — Inpatient Hospital Stay: Admission: RE | Admit: 2012-01-01 | Payer: 59 | Source: Ambulatory Visit

## 2012-01-03 ENCOUNTER — Ambulatory Visit
Admission: RE | Admit: 2012-01-03 | Discharge: 2012-01-03 | Disposition: A | Discharge status: Home / Self Care | Payer: 59 | Source: Ambulatory Visit | Attending: Otolaryngology | Admitting: Otolaryngology

## 2012-01-03 DIAGNOSIS — H905 Unspecified sensorineural hearing loss: Secondary | ICD-10-CM

## 2012-01-03 MED ORDER — GADOBENATE DIMEGLUMINE 529 MG/ML IV SOLN
20.0000 mL | Freq: Once | INTRAVENOUS | Status: AC | PRN
  Administered 2012-01-03: 20 mL via INTRAVENOUS

## 2012-02-04 ENCOUNTER — Other Ambulatory Visit (INDEPENDENT_AMBULATORY_CARE_PROVIDER_SITE_OTHER): Payer: 59

## 2012-02-04 ENCOUNTER — Ambulatory Visit (INDEPENDENT_AMBULATORY_CARE_PROVIDER_SITE_OTHER): Payer: 59 | Admitting: Internal Medicine

## 2012-02-04 ENCOUNTER — Encounter: Payer: Self-pay | Admitting: Internal Medicine

## 2012-02-04 VITALS — BP 140/98 | HR 64 | Temp 97.3°F | Resp 16 | Wt 262.0 lb

## 2012-02-04 DIAGNOSIS — E876 Hypokalemia: Secondary | ICD-10-CM

## 2012-02-04 DIAGNOSIS — I10 Essential (primary) hypertension: Secondary | ICD-10-CM

## 2012-02-04 DIAGNOSIS — Z23 Encounter for immunization: Secondary | ICD-10-CM

## 2012-02-04 LAB — BASIC METABOLIC PANEL
CO2: 26 mEq/L (ref 19–32)
Calcium: 9.1 mg/dL (ref 8.4–10.5)
Chloride: 104 mEq/L (ref 96–112)
Sodium: 139 mEq/L (ref 135–145)

## 2012-02-04 MED ORDER — NEBIVOLOL HCL 5 MG PO TABS: 5.0000 mg | ORAL_TABLET | Freq: Every day | ORAL | Status: DC

## 2012-02-04 NOTE — Progress Notes (Signed)
  Subjective:    Patient ID: [name] [name], male    DOB: August 21, 1970, 41 y.o.   MRN: [id number]  Hypertension This is a chronic problem. The current episode started more than 1 year ago. The problem has been gradually worsening since onset. The problem is uncontrolled. Pertinent negatives include no anxiety, blurred vision, chest pain, headaches, malaise/fatigue, neck pain, orthopnea, palpitations, peripheral edema, PND, shortness of breath or sweats. There are no associated agents to hypertension. Past treatments include nothing. Compliance problems include diet, exercise and psychosocial issues.       Review of Systems  Constitutional: Negative.  Negative for malaise/fatigue.  HENT: Negative.  Negative for neck pain.   Eyes: Negative.  Negative for blurred vision.  Respiratory: Negative for cough, chest tightness, shortness of breath, wheezing and stridor.   Cardiovascular: Negative for chest pain, palpitations, orthopnea, leg swelling and PND.  Gastrointestinal: Negative for nausea, vomiting, abdominal pain, diarrhea and constipation.  Genitourinary: Negative.   Musculoskeletal: Negative.   Skin: Negative.   Neurological: Negative.  Negative for headaches.  Hematological: Negative for adenopathy. Does not bruise/bleed easily.  Psychiatric/Behavioral: Negative.        Objective:   Physical Exam  Vitals reviewed. Constitutional: He is oriented to person, place, and time. He appears well-developed and well-nourished.  HENT:  Head: Normocephalic and atraumatic.  Mouth/Throat: Oropharynx is clear and moist. No oropharyngeal exudate.  Eyes: Conjunctivae are normal. Right eye exhibits no discharge. Left eye exhibits no discharge. No scleral icterus.  Neck: Normal range of motion. Neck supple. No JVD present. No tracheal deviation present. No thyromegaly present.  Cardiovascular: Normal rate, regular rhythm, normal heart sounds and intact distal pulses.  Exam reveals no gallop and no  friction rub.   No murmur heard. Pulmonary/Chest: Effort normal and breath sounds normal. No stridor. No respiratory distress. He has no wheezes. He has no rales. He exhibits no tenderness.  Abdominal: Soft. Bowel sounds are normal. He exhibits no distension and no mass. There is no tenderness. There is no rebound and no guarding.  Musculoskeletal: Normal range of motion. He exhibits no edema and no tenderness.  Lymphadenopathy:    He has no cervical adenopathy.  Neurological: He is oriented to person, place, and time.  Skin: Skin is warm and dry. No rash noted. He is not diaphoretic. No erythema. No pallor.  Psychiatric: He has a normal mood and affect. His behavior is normal. Judgment and thought content normal.     Lab Results  Component Value Date   WBC 8.1 08/13/2011   HGB 15.0 08/13/2011   HCT 45.1 08/13/2011   PLT 315.0 08/13/2011   GLUCOSE 86 11/05/2011   CHOL 181 08/13/2011   TRIG 123.0 08/13/2011   HDL 40.60 08/13/2011   LDLDIRECT 108.7 10/09/2009   LDLCALC 116* 08/13/2011   ALT 24 08/13/2011   AST 25 08/13/2011   NA 138 11/05/2011   K 3.4* 11/05/2011   CL 105 11/05/2011   CREATININE 0.9 11/05/2011   BUN 8 11/05/2011   CO2 29 11/05/2011   TSH 1.78 08/13/2011   HGBA1C 5.7 11/05/2011       Assessment & Plan:

## 2012-02-04 NOTE — Assessment & Plan Note (Signed)
I will recheck his BMP today

## 2012-02-04 NOTE — Patient Instructions (Signed)

## 2012-02-04 NOTE — Assessment & Plan Note (Signed)
He will start bystolic for BP control

## 2012-02-06 ENCOUNTER — Encounter: Payer: Self-pay | Admitting: Internal Medicine

## 2012-02-06 MED ORDER — POTASSIUM CHLORIDE CRYS ER 20 MEQ PO TBCR: 20.0000 meq | EXTENDED_RELEASE_TABLET | Freq: Two times a day (BID) | ORAL | Status: DC

## 2012-02-06 NOTE — Addendum Note (Signed)
Addended by: Etta Grandchild on: 02/06/2012 10:52 AM   Modules accepted: Orders

## 2012-04-07 ENCOUNTER — Ambulatory Visit: Payer: 59 | Admitting: Internal Medicine

## 2012-04-07 DIAGNOSIS — Z0289 Encounter for other administrative examinations: Secondary | ICD-10-CM

## 2012-05-05 ENCOUNTER — Ambulatory Visit: Payer: 59 | Admitting: Internal Medicine

## 2012-06-16 ENCOUNTER — Encounter: Payer: Self-pay | Admitting: Internal Medicine

## 2012-06-16 ENCOUNTER — Ambulatory Visit (INDEPENDENT_AMBULATORY_CARE_PROVIDER_SITE_OTHER): Payer: 59 | Admitting: Internal Medicine

## 2012-06-16 ENCOUNTER — Other Ambulatory Visit: Payer: Self-pay | Admitting: Internal Medicine

## 2012-06-16 ENCOUNTER — Other Ambulatory Visit (INDEPENDENT_AMBULATORY_CARE_PROVIDER_SITE_OTHER): Payer: 59

## 2012-06-16 VITALS — BP 138/96 | HR 64 | Temp 98.5°F | Resp 16 | Wt 269.0 lb

## 2012-06-16 DIAGNOSIS — E876 Hypokalemia: Secondary | ICD-10-CM

## 2012-06-16 DIAGNOSIS — M549 Dorsalgia, unspecified: Secondary | ICD-10-CM

## 2012-06-16 DIAGNOSIS — R7309 Other abnormal glucose: Secondary | ICD-10-CM

## 2012-06-16 DIAGNOSIS — I10 Essential (primary) hypertension: Secondary | ICD-10-CM

## 2012-06-16 LAB — URINALYSIS, ROUTINE W REFLEX MICROSCOPIC
Nitrite: NEGATIVE
Specific Gravity, Urine: 1.015 (ref 1.000–1.030)
Urine Glucose: NEGATIVE
Urobilinogen, UA: 1 (ref 0.0–1.0)

## 2012-06-16 LAB — BASIC METABOLIC PANEL
CO2: 28 mEq/L (ref 19–32)
GFR: 132.08 mL/min (ref >60.00)
Glucose, Bld: 95 mg/dL (ref 70–99)
Potassium: 3.4 mEq/L — ABNORMAL LOW (ref 3.5–5.1)
Sodium: 138 mEq/L (ref 135–145)

## 2012-06-16 MED ORDER — NEBIVOLOL HCL 5 MG PO TABS: 5.0000 mg | ORAL_TABLET | Freq: Every day | ORAL | Status: DC

## 2012-06-16 NOTE — Progress Notes (Signed)
Subjective:    Patient ID: [name] [name], male    DOB: May 26, 1971, 42 y.o.   MRN: [id number]  Back Pain This is a recurrent problem. The current episode started more than 1 month ago. The problem occurs intermittently. The problem is unchanged. The pain is present in the thoracic spine. The quality of the pain is described as aching. The pain does not radiate. The pain is at a severity of 2/10. The pain is mild. The pain is worse during the day. The symptoms are aggravated by twisting and position. Pertinent negatives include no abdominal pain, bladder incontinence, bowel incontinence, chest pain, dysuria, fever, headaches, leg pain, numbness, paresis, paresthesias, pelvic pain, perianal numbness, tingling, weakness or weight loss. Risk factors include obesity and lack of exercise. He has tried NSAIDs for the symptoms. The treatment provided moderate relief.  Hypertension This is a chronic problem. The current episode started more than 1 year ago. The problem is unchanged. The problem is uncontrolled. Pertinent negatives include no anxiety, blurred vision, chest pain, headaches, malaise/fatigue, neck pain, orthopnea, palpitations, peripheral edema, PND, shortness of breath or sweats. Agents associated with hypertension include NSAIDs. Past treatments include nothing. Compliance problems include psychosocial issues (he has not taken bystolic in a month or two).       Review of Systems  Constitutional: Negative for fever, chills, weight loss, malaise/fatigue, diaphoresis, activity change, appetite change, fatigue and unexpected weight change.  HENT: Negative.  Negative for neck pain.   Eyes: Negative.  Negative for blurred vision.  Respiratory: Negative for cough, chest tightness, shortness of breath, wheezing and stridor.   Cardiovascular: Negative for chest pain, palpitations, orthopnea, leg swelling and PND.  Gastrointestinal: Negative for nausea, vomiting, abdominal pain, diarrhea,  constipation and bowel incontinence.  Genitourinary: Positive for flank pain (left). Negative for bladder incontinence, dysuria, frequency, hematuria, enuresis, difficulty urinating and pelvic pain.  Musculoskeletal: Positive for back pain. Negative for myalgias, joint swelling, arthralgias and gait problem.  Skin: Negative for color change, pallor, rash and wound.  Neurological: Negative for dizziness, tingling, tremors, seizures, syncope, facial asymmetry, speech difficulty, weakness, light-headedness, numbness, headaches and paresthesias.  Hematological: Negative for adenopathy. Does not bruise/bleed easily.  Psychiatric/Behavioral: Negative.        Objective:   Physical Exam  Vitals reviewed. Constitutional: He is oriented to person, place, and time. He appears well-developed and well-nourished. No distress.  HENT:  Head: Normocephalic and atraumatic.  Mouth/Throat: Oropharynx is clear and moist. No oropharyngeal exudate.  Eyes: Conjunctivae normal are normal. Right eye exhibits no discharge. Left eye exhibits no discharge. No scleral icterus.  Neck: Normal range of motion. Neck supple. No JVD present. No tracheal deviation present. No thyromegaly present.  Cardiovascular: Normal rate, regular rhythm, normal heart sounds and intact distal pulses.  Exam reveals no gallop and no friction rub.   No murmur heard. Pulmonary/Chest: Effort normal and breath sounds normal. No stridor. No respiratory distress. He has no wheezes. He has no rales. He exhibits no tenderness.  Abdominal: Soft. Normal appearance and bowel sounds are normal. He exhibits no shifting dullness, no distension, no pulsatile liver, no fluid wave, no abdominal bruit, no ascites, no pulsatile midline mass and no mass. There is no hepatosplenomegaly, splenomegaly or hepatomegaly. There is no tenderness. There is no rigidity, no rebound, no guarding, no CVA tenderness, no tenderness at McBurney's point and negative Murphy's sign.  No hernia. Hernia confirmed negative in the ventral area.  Musculoskeletal: Normal range of motion. He exhibits no edema and  no tenderness.       Thoracic back: He exhibits tenderness and bony tenderness. He exhibits normal range of motion, no swelling, no edema, no deformity, no laceration, no pain, no spasm and normal pulse.       Back:  Lymphadenopathy:    He has no cervical adenopathy.  Neurological: He is alert and oriented to person, place, and time. He has normal strength and normal reflexes. He displays no atrophy, no tremor and normal reflexes. No cranial nerve deficit or sensory deficit. He exhibits normal muscle tone. He displays a negative Romberg sign. He displays no seizure activity. Coordination and gait normal.  Skin: Skin is warm and dry. No rash noted. He is not diaphoretic. No erythema. No pallor.  Psychiatric: He has a normal mood and affect. His behavior is normal. Judgment and thought content normal.      Lab Results  Component Value Date   WBC 8.1 08/13/2011   HGB 15.0 08/13/2011   HCT 45.1 08/13/2011   PLT 315.0 08/13/2011   GLUCOSE 120* 02/04/2012   CHOL 181 08/13/2011   TRIG 123.0 08/13/2011   HDL 40.60 08/13/2011   LDLDIRECT 108.7 10/09/2009   LDLCALC 116* 08/13/2011   ALT 24 08/13/2011   AST 25 08/13/2011   NA 139 02/04/2012   K 3.2* 02/04/2012   CL 104 02/04/2012   CREATININE 1.0 02/04/2012   BUN 8 02/04/2012   CO2 26 02/04/2012   TSH 1.78 08/13/2011   HGBA1C 5.7 11/05/2011      Assessment & Plan:

## 2012-06-16 NOTE — Patient Instructions (Signed)
Hypertension  As your heart beats, it forces blood through your arteries. This force is your blood pressure. If the pressure is too high, it is called hypertension (HTN) or high blood pressure. HTN is dangerous because you may have it and not know it. High blood pressure may mean that your heart has to work harder to pump blood. Your arteries may be narrow or stiff. The extra work puts you at risk for heart disease, stroke, and other problems.   Blood pressure consists of two numbers, a higher number over a lower, 110/72, for example. It is stated as "110 over 72." The ideal is below 120 for the top number (systolic) and under 80 for the bottom (diastolic). Write down your blood pressure today.  You should pay close attention to your blood pressure if you have certain conditions such as:   Heart failure.   Prior heart attack.   Diabetes   Chronic kidney disease.   Prior stroke.   Multiple risk factors for heart disease.  To see if you have HTN, your blood pressure should be measured while you are seated with your arm held at the level of the heart. It should be measured at least twice. A one-time elevated blood pressure reading (especially in the Emergency Department) does not mean that you need treatment. There may be conditions in which the blood pressure is different between your right and left arms. It is important to see your caregiver soon for a recheck.  Most people have essential hypertension which means that there is not a specific cause. This type of high blood pressure may be lowered by changing lifestyle factors such as:   Stress.   Smoking.   Lack of exercise.   Excessive weight.   Drug/tobacco/alcohol use.   Eating less salt.  Most people do not have symptoms from high blood pressure until it has caused damage to the body. Effective treatment can often prevent, delay or reduce that damage.  TREATMENT   When a cause has been identified, treatment for high blood pressure is directed at the  cause. There are a large number of medications to treat HTN. These fall into several categories, and your caregiver will help you select the medicines that are best for you. Medications may have side effects. You should review side effects with your caregiver.  If your blood pressure stays high after you have made lifestyle changes or started on medicines,    Your medication(s) may need to be changed.   Other problems may need to be addressed.   Be certain you understand your prescriptions, and know how and when to take your medicine.   Be sure to follow up with your caregiver within the time frame advised (usually within two weeks) to have your blood pressure rechecked and to review your medications.   If you are taking more than one medicine to lower your blood pressure, make sure you know how and at what times they should be taken. Taking two medicines at the same time can result in blood pressure that is too low.  SEEK IMMEDIATE MEDICAL CARE IF:   You develop a severe headache, blurred or changing vision, or confusion.   You have unusual weakness or numbness, or a faint feeling.   You have severe chest or abdominal pain, vomiting, or breathing problems.  MAKE SURE YOU:    Understand these instructions.   Will watch your condition.   Will get help right away if you are not doing well   or get worse.  Document Released: 05/17/2005 Document Revised: 08/09/2011 Document Reviewed: 01/05/2008  Exit[employer] Patient Information 2013 [employer]  Back Pain, Adult  Low back pain is very common. About 1 in 5 people have back pain.The cause of low back pain is rarely dangerous. The pain often gets better over time.About half of people with a sudden onset of back pain feel better in just 2 weeks. About 8 in 10 people feel better by 6 weeks.   CAUSES  Some common causes of back pain include:   Strain of the muscles or ligaments supporting the spine.   Wear and tear (degeneration) of the spinal  discs.   Arthritis.   Direct injury to the back.  DIAGNOSIS  Most of the time, the direct cause of low back pain is not known.However, back pain can be treated effectively even when the exact cause of the pain is unknown.Answering your caregiver's questions about your overall health and symptoms is one of the most accurate ways to make sure the cause of your pain is not dangerous. If your caregiver needs more information, he or she may order lab work or imaging tests (X-rays or MRIs).However, even if imaging tests show changes in your back, this usually does not require surgery.  HOME CARE INSTRUCTIONS  For many people, back pain returns.Since low back pain is rarely dangerous, it is often a condition that people can learn to manageon their own.    Remain active. It is stressful on the back to sit or stand in one place. Do not sit, drive, or stand in one place for more than 30 minutes at a time. Take short walks on level surfaces as soon as pain allows.Try to increase the length of time you walk each day.   Do not stay in bed.Resting more than 1 or 2 days can delay your recovery.   Do not avoid exercise or work.Your body is made to move.It is not dangerous to be active, even though your back may hurt.Your back will likely heal faster if you return to being active before your pain is gone.   Pay attention to your body when you bend and lift. Many people have less discomfortwhen lifting if they bend their knees, keep the load close to their bodies,and avoid twisting. Often, the most comfortable positions are those that put less stress on your recovering back.   Find a comfortable position to sleep. Use a firm mattress and lie on your side with your knees slightly bent. If you lie on your back, put a pillow under your knees.   Only take over-the-counter or prescription medicines as directed by your caregiver. Over-the-counter medicines to reduce pain and inflammation are often the most  helpful.Your caregiver may prescribe muscle relaxant drugs.These medicines help dull your pain so you can more quickly return to your normal activities and healthy exercise.   Put ice on the injured area.   Put ice in a plastic bag.   Place a towel between your skin and the bag.   Leave the ice on for 15 to 20 minutes, 3 to 4 times a day for the first 2 to 3 days. After that, ice and heat may be alternated to reduce pain and spasms.   Ask your caregiver about trying back exercises and gentle massage. This may be of some benefit.   Avoid feeling anxious or stressed.Stress increases muscle tension and can worsen back pain.It is important to recognize when you are anxious or stressed and   learn ways to manage it.Exercise is a great option.  SEEK MEDICAL CARE IF:   You have pain that is not relieved with rest or medicine.   You have pain that does not improve in 1 week.   You have new symptoms.   You are generally not feeling well.  SEEK IMMEDIATE MEDICAL CARE IF:    You have pain that radiates from your back into your legs.   You develop new bowel or bladder control problems.   You have unusual weakness or numbness in your arms or legs.   You develop nausea or vomiting.   You develop abdominal pain.   You feel faint.  Document Released: 05/17/2005 Document Revised: 11/16/2011 Document Reviewed: 10/05/2010  [employer] Patient Information 2013 [employer]

## 2012-06-18 ENCOUNTER — Encounter: Payer: Self-pay | Admitting: Internal Medicine

## 2012-06-19 ENCOUNTER — Encounter: Payer: Self-pay | Admitting: Internal Medicine

## 2012-06-19 NOTE — Assessment & Plan Note (Signed)
He will restart bystolic 

## 2012-06-19 NOTE — Assessment & Plan Note (Signed)
I will check his a1c to see if he has developed DM II 

## 2012-06-19 NOTE — Assessment & Plan Note (Signed)
I will check a plain film to see if he has DDD, occult fracture, bone spurs, etc He will continue nsaids

## 2012-06-19 NOTE — Assessment & Plan Note (Signed)
I will recheck his K+ and Mg++ levels today 

## 2012-08-03 ENCOUNTER — Ambulatory Visit (INDEPENDENT_AMBULATORY_CARE_PROVIDER_SITE_OTHER): Payer: 59 | Admitting: Internal Medicine

## 2012-08-03 ENCOUNTER — Other Ambulatory Visit (INDEPENDENT_AMBULATORY_CARE_PROVIDER_SITE_OTHER): Payer: 59

## 2012-08-03 ENCOUNTER — Encounter: Payer: Self-pay | Admitting: Internal Medicine

## 2012-08-03 ENCOUNTER — Ambulatory Visit (INDEPENDENT_AMBULATORY_CARE_PROVIDER_SITE_OTHER)
Admission: RE | Admit: 2012-08-03 | Discharge: 2012-08-03 | Disposition: A | Discharge status: Home / Self Care | Payer: 59 | Source: Ambulatory Visit | Attending: Internal Medicine | Admitting: Internal Medicine

## 2012-08-03 VITALS — BP 120/84 | HR 74 | Temp 98.7°F | Resp 16 | Wt 267.0 lb

## 2012-08-03 DIAGNOSIS — J209 Acute bronchitis, unspecified: Secondary | ICD-10-CM | POA: Insufficient documentation

## 2012-08-03 DIAGNOSIS — E876 Hypokalemia: Secondary | ICD-10-CM

## 2012-08-03 DIAGNOSIS — I10 Essential (primary) hypertension: Secondary | ICD-10-CM

## 2012-08-03 DIAGNOSIS — R05 Cough: Secondary | ICD-10-CM

## 2012-08-03 LAB — MAGNESIUM: Magnesium: 2.2 mg/dL (ref 1.5–2.5)

## 2012-08-03 LAB — BASIC METABOLIC PANEL
GFR: 102.61 mL/min (ref >60.00)
Glucose, Bld: 85 mg/dL (ref 70–99)
Potassium: 3.9 mEq/L (ref 3.5–5.1)
Sodium: 137 mEq/L (ref 135–145)

## 2012-08-03 MED ORDER — AZITHROMYCIN 500 MG PO TABS: 500.0000 mg | ORAL_TABLET | Freq: Every day | ORAL | Status: DC

## 2012-08-03 MED ORDER — NEBIVOLOL HCL 5 MG PO TABS: 5.0000 mg | ORAL_TABLET | Freq: Every day | ORAL | Status: DC

## 2012-08-03 MED ORDER — HYDROCOD POLST-CPM POLST ER 10-8 MG PO CP12: 1.0000 | ORAL_CAPSULE | Freq: Two times a day (BID) | ORAL | Status: DC | PRN

## 2012-08-03 NOTE — Patient Instructions (Signed)

## 2012-08-03 NOTE — Progress Notes (Signed)
Subjective:    Patient ID: [name] [name], male    DOB: [birth year], 42 y.o.   MRN: [id number]  Cough This is a new problem. The current episode started in the past 7 days. The problem has been unchanged. The problem occurs every few hours. The cough is productive of purulent sputum. Associated symptoms include chills, a fever, nasal congestion and a sore throat. Pertinent negatives include no chest pain, ear congestion, ear pain, headaches, heartburn, hemoptysis, myalgias, postnasal drip, rash, rhinorrhea, shortness of breath, sweats, weight loss or wheezing. Nothing aggravates the symptoms. He has tried nothing for the symptoms. The treatment provided no relief.      Review of Systems  Constitutional: Positive for fever and chills. Negative for weight loss, diaphoresis, activity change, appetite change, fatigue and unexpected weight change.  HENT: Positive for sore throat. Negative for ear pain, rhinorrhea and postnasal drip.   Eyes: Negative.   Respiratory: Positive for cough. Negative for hemoptysis, choking, shortness of breath, wheezing and stridor.   Cardiovascular: Negative for chest pain, palpitations and leg swelling.  Gastrointestinal: Negative for heartburn, nausea, vomiting, abdominal pain, diarrhea, constipation and blood in stool.  Endocrine: Negative.   Genitourinary: Negative.   Musculoskeletal: Negative for myalgias, back pain, joint swelling, arthralgias and gait problem.  Skin: Negative for color change, pallor, rash and wound.  Allergic/Immunologic: Negative.   Neurological: Negative.  Negative for dizziness, speech difficulty, weakness, light-headedness and headaches.  Hematological: Negative for adenopathy. Does not bruise/bleed easily.  Psychiatric/Behavioral: Negative.        Objective:   Physical Exam  Vitals reviewed. Constitutional: He is oriented to person, place, and time. He appears well-developed and well-nourished.  Non-toxic appearance. He does not  have a sickly appearance. He does not appear ill. No distress.  HENT:  Head: Normocephalic and atraumatic. No trismus in the jaw.  Mouth/Throat: Oropharynx is clear and moist and mucous membranes are normal. Mucous membranes are not pale, not dry and not cyanotic. No oral lesions. No edematous. No oropharyngeal exudate, posterior oropharyngeal edema, posterior oropharyngeal erythema or tonsillar abscesses.  Eyes: Conjunctivae are normal. Right eye exhibits no discharge. Left eye exhibits no discharge. No scleral icterus.  Neck: Normal range of motion. Neck supple. No JVD present. No tracheal deviation present. No thyromegaly present.  Cardiovascular: Normal rate, regular rhythm, normal heart sounds and intact distal pulses.  Exam reveals no gallop and no friction rub.   No murmur heard. Pulmonary/Chest: Effort normal and breath sounds normal. No stridor. No respiratory distress. He has no wheezes. He has no rales. He exhibits no tenderness.  Abdominal: Soft. Bowel sounds are normal. He exhibits no distension and no mass. There is no tenderness. There is no rebound and no guarding.  Musculoskeletal: Normal range of motion. He exhibits no edema and no tenderness.  Lymphadenopathy:    He has no cervical adenopathy.  Neurological: He is oriented to person, place, and time.  Skin: Skin is warm and dry. No rash noted. He is not diaphoretic. No erythema. No pallor.  Psychiatric: He has a normal mood and affect. His behavior is normal. Judgment and thought content normal.      Lab Results  Component Value Date   WBC 8.1 08/13/2011   HGB 15.0 08/13/2011   HCT 45.1 08/13/2011   PLT 315.0 08/13/2011   GLUCOSE 95 06/16/2012   CHOL 181 08/13/2011   TRIG 123.0 08/13/2011   HDL 40.60 08/13/2011   LDLDIRECT 108.7 10/09/2009   LDLCALC 116* 08/13/2011  ALT 24 08/13/2011   AST 25 08/13/2011   NA 138 06/16/2012   K 3.4* 06/16/2012   CL 104 06/16/2012   CREATININE 0.8 06/16/2012   BUN 8 06/16/2012   CO2 28  06/16/2012   TSH 1.78 08/13/2011   HGBA1C 5.8 06/16/2012      Assessment & Plan:

## 2012-08-04 ENCOUNTER — Encounter: Payer: Self-pay | Admitting: Internal Medicine

## 2012-08-04 NOTE — Assessment & Plan Note (Signed)
His BP is well controlled 

## 2012-08-04 NOTE — Assessment & Plan Note (Signed)
I will check his CXR to see if he has PNA 

## 2012-08-04 NOTE — Assessment & Plan Note (Signed)
Start zpak for the infection and a cough suppressant 

## 2012-08-04 NOTE — Assessment & Plan Note (Signed)
I will recheck his K+ level today 

## 2012-08-11 ENCOUNTER — Ambulatory Visit: Payer: 59 | Admitting: Internal Medicine

## 2012-08-22 ENCOUNTER — Ambulatory Visit (INDEPENDENT_AMBULATORY_CARE_PROVIDER_SITE_OTHER): Payer: 59 | Admitting: Internal Medicine

## 2012-08-22 ENCOUNTER — Encounter: Payer: Self-pay | Admitting: Internal Medicine

## 2012-08-22 ENCOUNTER — Telehealth: Payer: Self-pay | Admitting: Internal Medicine

## 2012-08-22 VITALS — BP 128/84 | HR 64 | Temp 98.4°F | Ht 70.0 in | Wt 263.0 lb

## 2012-08-22 DIAGNOSIS — I10 Essential (primary) hypertension: Secondary | ICD-10-CM

## 2012-08-22 DIAGNOSIS — E876 Hypokalemia: Secondary | ICD-10-CM

## 2012-08-22 DIAGNOSIS — J019 Acute sinusitis, unspecified: Secondary | ICD-10-CM

## 2012-08-22 MED ORDER — POTASSIUM CHLORIDE CRYS ER 20 MEQ PO TBCR: 20.0000 meq | EXTENDED_RELEASE_TABLET | Freq: Two times a day (BID) | ORAL | Status: DC

## 2012-08-22 MED ORDER — AMOXICILLIN-POT CLAVULANATE 875-125 MG PO TABS: 1.0000 | ORAL_TABLET | Freq: Two times a day (BID) | ORAL | Status: DC

## 2012-08-22 NOTE — Telephone Encounter (Signed)
Patient Information:  Caller Name: Dejaun  Phone: [phone]  Patient: [name], [name]  Gender: Male  DOB: [birth year]  Age: 42 Years  PCP: Sanda Linger (Adults only)  Office Follow Up:  Does the office need to follow up with this patient?: No  Instructions For The Office: N/A   Symptoms  Reason For Call & Symptoms: Reports nasal congestion/runny nose with blood drainage at times. BP 160/100. Dizziness at times when more active.  Reviewed Health History In EMR: Yes  Reviewed Medications In EMR: Yes  Reviewed Allergies In EMR: Yes  Reviewed Surgeries / Procedures: Yes  Date of Onset of Symptoms: 08/20/2012  Treatments Tried: Mucinex Cold and Sinus  Treatments Tried Worked: No  Guideline(s) Used:  High Blood Pressure  Disposition Per Guideline:   See Today in Office  Reason For Disposition Reached:   Patient wants to be seen  Advice Given:  N/A  Patient Will Follow Care Advice:  YES  Appointment Scheduled:  08/22/2012 15:15:00 Appointment Scheduled Provider:  Nicki Reaper

## 2012-08-22 NOTE — Progress Notes (Signed)
HPI  Pt presents to the clinic today with 1 week history of fatigue, fever, nasal congestion, bloody nose and sore throat. He has taken Mucinex and tylenol cold and sinus without much relief. He was treated 3 weeks ago for acute bronchitis with azithromax. He has had sick contacts.  Review of Systems    Past Medical History  Diagnosis Date  . Gallstone   . Chronic headaches   . Nephrolithiasis   . Hypertension     Family History  Problem Relation Age of Onset  . Arthritis    . Diabetes    . Cancer Neg Hx   . Depression Neg Hx   . Early death Neg Hx   . Heart disease Neg Hx   . Hypertension Neg Hx   . Kidney disease Neg Hx   . Hyperlipidemia Neg Hx   . Stroke Neg Hx     History   Social History  . Marital Status: Married    Spouse Name: N/A    Number of Children: N/A  . Years of Education: N/A   Occupational History  . Not on file.   Social History Main Topics  . Smoking status: Former Smoker    Quit date: 05/31/1994  . Smokeless tobacco: Never Used  . Alcohol Use: No  . Drug Use: No  . Sexually Active: Yes   Other Topics Concern  . Not on file   Social History Narrative   Occupation: [employer]   Regular Exercise-yes                No Known Allergies   Constitutional: Positive headache, fatigue and fever. Denies abrupt weight changes.  HEENT:  Positive eye pain, pressure behind the eyes, facial pain, nasal congestion, bloody nose and sore throat. Denies eye redness, ear pain, ringing in the ears, wax buildup, runny nose. Respiratory:. Denies cough or difficulty breathing or shortness of breath.  Cardiovascular: Denies chest pain, chest tightness, palpitations or swelling in the hands or feet.   No other specific complaints in a complete review of systems (except as listed in HPI above).  Objective:    BP 128/84  Pulse 64  Temp(Src) 98.4 F (36.9 C) (Oral)  Ht 5\' 10"  (1.778 m)  Wt 263 lb (119.296 kg)  BMI 37.74 kg/m2  SpO2 98% Wt Readings  from Last 3 Encounters:  08/22/12 263 lb (119.296 kg)  08/03/12 267 lb (121.11 kg)  06/16/12 269 lb (122.018 kg)    General: Appears his stated age, well developed, well nourished in NAD. HEENT: Head: normal shape and size; Eyes: sclera white, no icterus, conjunctiva pink, PERRLA and EOMs intact; Ears: Tm's gray and intact, normal light reflex; Nose: mucosa pink and moist, septum midline; Throat/Mouth: + PND. Teeth present, mucosa pink and moist, no exudate noted, no lesions or ulcerations noted.  Neck: Mild cervical lymphadenopathy. Neck supple, trachea midline. No massses, lumps or thyromegaly present.  Cardiovascular: Normal rate and rhythm. S1,S2 noted.  No murmur, rubs or gallops noted. No JVD or BLE edema. No carotid bruits noted. Pulmonary/Chest: Normal effort and positive vesicular breath sounds. No respiratory distress. No wheezes, rales or ronchi noted.      Assessment & Plan:   Acute bacterial sinusitis  Can use a Neti Pot which can be purchased from your local drug store. Flonase 2 sprays each nostril for 3 days and then as needed. Augmentin BID for 10 days  RTC as needed or if symptoms persist.

## 2012-08-22 NOTE — Patient Instructions (Signed)

## 2012-08-25 ENCOUNTER — Encounter: Payer: Self-pay | Admitting: Internal Medicine

## 2012-08-25 ENCOUNTER — Ambulatory Visit (INDEPENDENT_AMBULATORY_CARE_PROVIDER_SITE_OTHER): Payer: 59 | Admitting: Internal Medicine

## 2012-08-25 VITALS — BP 112/80 | HR 64 | Temp 98.2°F | Resp 16 | Wt 263.0 lb

## 2012-08-25 DIAGNOSIS — J309 Allergic rhinitis, unspecified: Secondary | ICD-10-CM

## 2012-08-25 DIAGNOSIS — J019 Acute sinusitis, unspecified: Secondary | ICD-10-CM

## 2012-08-25 MED ORDER — METHYLPREDNISOLONE ACETATE 80 MG/ML IJ SUSP
80.0000 mg | Freq: Once | INTRAMUSCULAR | Status: AC
  Administered 2012-08-25: 80 mg via INTRAMUSCULAR

## 2012-08-25 MED ORDER — BECLOMETHASONE DIPROPIONATE 80 MCG/ACT NA AERS: 4.0000 | INHALATION_SPRAY | Freq: Every day | NASAL | Status: DC

## 2012-08-25 MED ORDER — METHYLPREDNISOLONE ACETATE 40 MG/ML IJ SUSP
40.0000 mg | Freq: Once | INTRAMUSCULAR | Status: AC
  Administered 2012-08-25: 40 mg via INTRAMUSCULAR

## 2012-08-25 MED ORDER — CETIRIZINE HCL 10 MG PO TABS: 10.0000 mg | ORAL_TABLET | Freq: Every day | ORAL | Status: DC

## 2012-08-25 NOTE — Assessment & Plan Note (Signed)
He is having a flare of symptoms so I have him an injection of depo-medrol IM and have asked him to start taking zyrtec and using Qnsal

## 2012-08-25 NOTE — Patient Instructions (Signed)
Allergic Rhinitis  Allergic rhinitis is when the mucous membranes in the nose respond to allergens. Allergens are particles in the air that cause your body to have an allergic reaction. This causes you to release allergic antibodies. Through a chain of events, these eventually cause you to release histamine into the blood stream (hence the use of antihistamines). Although meant to be protective to the body, it is this release that causes your discomfort, such as frequent sneezing, congestion and an itchy runny nose.    CAUSES    The pollen allergens may come from grasses, trees, and weeds. This is seasonal allergic rhinitis, or "hay fever." Other allergens cause year-round allergic rhinitis (perennial allergic rhinitis) such as house dust mite allergen, pet dander and mold spores.    SYMPTOMS     Nasal stuffiness (congestion).   Runny, itchy nose with sneezing and tearing of the eyes.   There is often an itching of the mouth, eyes and ears.  It cannot be cured, but it can be controlled with medications.  DIAGNOSIS    If you are unable to determine the offending allergen, skin or blood testing may find it.  TREATMENT     Avoid the allergen.   Medications and allergy shots (immunotherapy) can help.   Hay fever may often be treated with antihistamines in pill or nasal spray forms. Antihistamines block the effects of histamine. There are over-the-counter medicines that may help with nasal congestion and swelling around the eyes. Check with your caregiver before taking or giving this medicine.  If the treatment above does not work, there are many new medications your caregiver can prescribe. Stronger medications may be used if initial measures are ineffective. Desensitizing injections can be used if medications and avoidance fails. Desensitization is when a patient is given ongoing shots until the body becomes less sensitive to the allergen. Make sure you follow up with your caregiver if problems continue.   SEEK MEDICAL CARE IF:     You develop fever (more than 100.5 F (38.1 C).   You develop a cough that does not stop easily (persistent).   You have shortness of breath.   You start wheezing.   Symptoms interfere with normal daily activities.  Document Released: 02/09/2001 Document Revised: 08/09/2011 Document Reviewed: 08/21/2008  Exit[employer] Patient Information 2013 [employer].

## 2012-08-25 NOTE — Progress Notes (Signed)
Subjective:    Patient ID: [name] [name], male    DOB: 1971/01/20, 42 y.o.   MRN: [id number]  Sinusitis This is a recurrent problem. The current episode started 1 to 4 weeks ago. The problem is unchanged. There has been no fever. The fever has been present for less than 1 day. His pain is at a severity of 0/10. He is experiencing no pain. Associated symptoms include congestion, sinus pressure and sneezing. Pertinent negatives include no chills, coughing, diaphoresis, ear pain, headaches, hoarse voice, neck pain, shortness of breath, sore throat or swollen glands. Past treatments include antibiotics and spray decongestants. The treatment provided mild relief.      Review of Systems  Constitutional: Negative.  Negative for fever, chills, diaphoresis, activity change, appetite change, fatigue and unexpected weight change.  HENT: Positive for congestion, rhinorrhea, sneezing, postnasal drip and sinus pressure. Negative for ear pain, nosebleeds, sore throat, hoarse voice, facial swelling, mouth sores, trouble swallowing, neck pain, dental problem, voice change and tinnitus.   Eyes: Negative.   Respiratory: Negative.  Negative for cough, chest tightness, shortness of breath, wheezing and stridor.   Cardiovascular: Negative.  Negative for chest pain, palpitations and leg swelling.  Gastrointestinal: Negative.  Negative for nausea, vomiting, abdominal pain, diarrhea, constipation and blood in stool.  Endocrine: Negative.   Genitourinary: Negative.   Musculoskeletal: Negative.  Negative for myalgias, back pain, joint swelling, arthralgias and gait problem.  Skin: Negative.   Neurological: Positive for dizziness. Negative for weakness, light-headedness and headaches.  Hematological: Negative.  Negative for adenopathy. Does not bruise/bleed easily.  Psychiatric/Behavioral: Negative.        Objective:   Physical Exam  Vitals reviewed. Constitutional: He is oriented to person, place, and time. He  appears well-developed and well-nourished.  Non-toxic appearance. He does not have a sickly appearance. He does not appear ill. No distress.  HENT:  Head: No trismus in the jaw.  Right Ear: Hearing, tympanic membrane, external ear and ear canal normal.  Left Ear: Hearing, tympanic membrane, external ear and ear canal normal.  Nose: Mucosal edema and rhinorrhea present. No nose lacerations, sinus tenderness, nasal deformity, septal deviation or nasal septal hematoma.  No foreign bodies. Right sinus exhibits no maxillary sinus tenderness and no frontal sinus tenderness. Left sinus exhibits no maxillary sinus tenderness and no frontal sinus tenderness.  Mouth/Throat: Uvula is midline, oropharynx is clear and moist and mucous membranes are normal. Mucous membranes are not pale, not dry and not cyanotic. No oral lesions. No edematous. No oropharyngeal exudate, posterior oropharyngeal edema, posterior oropharyngeal erythema or tonsillar abscesses.  Eyes: Conjunctivae are normal. Right eye exhibits no discharge. Left eye exhibits no discharge. No scleral icterus.  Neck: Normal range of motion. Neck supple. No JVD present. No tracheal deviation present. No thyromegaly present.  Cardiovascular: Normal rate, regular rhythm, normal heart sounds and intact distal pulses.  Exam reveals no gallop and no friction rub.   No murmur heard. Pulmonary/Chest: Effort normal and breath sounds normal. No stridor. No respiratory distress. He has no wheezes. He has no rales. He exhibits no tenderness.  Abdominal: Soft. Bowel sounds are normal. He exhibits no distension and no mass. There is no tenderness. There is no rebound and no guarding.  Musculoskeletal: Normal range of motion. He exhibits no edema and no tenderness.  Lymphadenopathy:    He has no cervical adenopathy.  Neurological: He is oriented to person, place, and time.  Skin: Skin is warm and dry. No rash noted. He is  not diaphoretic. No erythema. No pallor.   Psychiatric: He has a normal mood and affect. His behavior is normal. Judgment and thought content normal.      Lab Results  Component Value Date   WBC 8.1 08/13/2011   HGB 15.0 08/13/2011   HCT 45.1 08/13/2011   PLT 315.0 08/13/2011   GLUCOSE 85 08/03/2012   CHOL 181 08/13/2011   TRIG 123.0 08/13/2011   HDL 40.60 08/13/2011   LDLDIRECT 108.7 10/09/2009   LDLCALC 116* 08/13/2011   ALT 24 08/13/2011   AST 25 08/13/2011   NA 137 08/03/2012   K 3.9 08/03/2012   CL 101 08/03/2012   CREATININE 1.0 08/03/2012   BUN 9 08/03/2012   CO2 30 08/03/2012   TSH 1.78 08/13/2011   HGBA1C 5.8 06/16/2012      Assessment & Plan:

## 2012-08-25 NOTE — Assessment & Plan Note (Signed)
Continue augmentin. 

## 2012-08-31 DIAGNOSIS — Z0279 Encounter for issue of other medical certificate: Secondary | ICD-10-CM

## 2012-10-31 ENCOUNTER — Ambulatory Visit: Payer: 59 | Admitting: Internal Medicine

## 2012-10-31 ENCOUNTER — Other Ambulatory Visit: Payer: Self-pay

## 2012-10-31 DIAGNOSIS — I10 Essential (primary) hypertension: Secondary | ICD-10-CM

## 2012-10-31 MED ORDER — NEBIVOLOL HCL 5 MG PO TABS: 5.0000 mg | ORAL_TABLET | Freq: Every day | ORAL | Status: DC

## 2012-12-21 ENCOUNTER — Emergency Department (HOSPITAL_COMMUNITY)
Admission: EM | Admit: 2012-12-21 | Discharge: 2012-12-21 | Disposition: A | Discharge status: Home / Self Care | Payer: 59 | Attending: Emergency Medicine | Admitting: Emergency Medicine

## 2012-12-21 ENCOUNTER — Encounter (HOSPITAL_COMMUNITY): Payer: Self-pay

## 2012-12-21 ENCOUNTER — Emergency Department (HOSPITAL_COMMUNITY): Payer: 59

## 2012-12-21 DIAGNOSIS — R55 Syncope and collapse: Secondary | ICD-10-CM

## 2012-12-21 DIAGNOSIS — Z79899 Other long term (current) drug therapy: Secondary | ICD-10-CM | POA: Insufficient documentation

## 2012-12-21 DIAGNOSIS — I10 Essential (primary) hypertension: Secondary | ICD-10-CM | POA: Insufficient documentation

## 2012-12-21 DIAGNOSIS — G43109 Migraine with aura, not intractable, without status migrainosus: Secondary | ICD-10-CM

## 2012-12-21 DIAGNOSIS — Z8719 Personal history of other diseases of the digestive system: Secondary | ICD-10-CM | POA: Insufficient documentation

## 2012-12-21 DIAGNOSIS — G43909 Migraine, unspecified, not intractable, without status migrainosus: Secondary | ICD-10-CM | POA: Insufficient documentation

## 2012-12-21 DIAGNOSIS — R51 Headache: Secondary | ICD-10-CM

## 2012-12-21 DIAGNOSIS — Z87448 Personal history of other diseases of urinary system: Secondary | ICD-10-CM | POA: Insufficient documentation

## 2012-12-21 DIAGNOSIS — Z87891 Personal history of nicotine dependence: Secondary | ICD-10-CM | POA: Insufficient documentation

## 2012-12-21 DIAGNOSIS — R531 Weakness: Secondary | ICD-10-CM

## 2012-12-21 DIAGNOSIS — R5383 Other fatigue: Secondary | ICD-10-CM

## 2012-12-21 LAB — COMPREHENSIVE METABOLIC PANEL
ALT: 22 U/L (ref 0–53)
AST: 21 U/L (ref 0–37)
Albumin: 3.9 g/dL (ref 3.5–5.2)
Alkaline Phosphatase: 58 U/L (ref 39–117)
CO2: 28 mEq/L (ref 19–32)
Chloride: 101 mEq/L (ref 96–112)
Creatinine, Ser: 0.89 mg/dL (ref 0.50–1.35)
Potassium: 3.2 mEq/L — ABNORMAL LOW (ref 3.5–5.1)
Sodium: 137 mEq/L (ref 135–145)
Total Bilirubin: 0.5 mg/dL (ref 0.3–1.2)

## 2012-12-21 LAB — URINALYSIS, ROUTINE W REFLEX MICROSCOPIC
Bilirubin Urine: NEGATIVE
Glucose, UA: NEGATIVE mg/dL
Nitrite: NEGATIVE
Specific Gravity, Urine: 1.017 (ref 1.005–1.030)
pH: 7 (ref 5.0–8.0)

## 2012-12-21 LAB — CBC WITH DIFFERENTIAL/PLATELET
Basophils Absolute: 0 10*3/uL (ref 0.0–0.1)
Basophils Relative: 1 % (ref 0–1)
Lymphocytes Relative: 32 % (ref 12–46)
MCHC: 36.9 g/dL — ABNORMAL HIGH (ref 30.0–36.0)
Neutro Abs: 4.9 10*3/uL (ref 1.7–7.7)
Neutrophils Relative %: 60 % (ref 43–77)
Platelets: 330 10*3/uL (ref 150–400)
RDW: 13.7 % (ref 11.5–15.5)
WBC: 8.2 10*3/uL (ref 4.0–10.5)

## 2012-12-21 LAB — POCT I-STAT 3, ART BLOOD GAS (G3+)
O2 Saturation: 94 %
TCO2: 28 mmol/L (ref 0–100)
pCO2 arterial: 43.3 mmHg (ref 35.0–45.0)
pO2, Arterial: 74 mmHg — ABNORMAL LOW (ref 80.0–100.0)

## 2012-12-21 LAB — POCT I-STAT, CHEM 8
Chloride: 102 mEq/L (ref 96–112)
HCT: 43 % (ref 39.0–52.0)
Potassium: 3.2 mEq/L — ABNORMAL LOW (ref 3.5–5.1)

## 2012-12-21 LAB — RAPID URINE DRUG SCREEN, HOSP PERFORMED
Benzodiazepines: NOT DETECTED
Cocaine: NOT DETECTED
Opiates: NOT DETECTED
Tetrahydrocannabinol: NOT DETECTED

## 2012-12-21 LAB — PROTIME-INR: INR: 0.91 (ref 0.00–1.49)

## 2012-12-21 LAB — GLUCOSE, CAPILLARY: Glucose-Capillary: 115 mg/dL — ABNORMAL HIGH (ref 70–99)

## 2012-12-21 LAB — POCT I-STAT TROPONIN I

## 2012-12-21 MED ORDER — SODIUM CHLORIDE 0.9 % IV SOLN
1000.0000 mL | INTRAVENOUS | Status: DC
  Administered 2012-12-21: 1000 mL via INTRAVENOUS

## 2012-12-21 MED ORDER — SODIUM CHLORIDE 0.9 % IV SOLN: Freq: Once | INTRAVENOUS | Status: DC

## 2012-12-21 MED ORDER — PROCHLORPERAZINE EDISYLATE 5 MG/ML IJ SOLN
10.0000 mg | Freq: Once | INTRAMUSCULAR | Status: AC
  Administered 2012-12-21: 10 mg via INTRAVENOUS
  Filled 2012-12-21: qty 2

## 2012-12-21 MED ORDER — KETOROLAC TROMETHAMINE 30 MG/ML IJ SOLN
30.0000 mg | Freq: Once | INTRAMUSCULAR | Status: AC
  Administered 2012-12-21: 30 mg via INTRAVENOUS
  Filled 2012-12-21: qty 1

## 2012-12-21 MED ORDER — POTASSIUM CHLORIDE CRYS ER 20 MEQ PO TBCR
40.0000 meq | EXTENDED_RELEASE_TABLET | Freq: Once | ORAL | Status: AC
  Administered 2012-12-21: 40 meq via ORAL
  Filled 2012-12-21: qty 2

## 2012-12-21 MED ORDER — POTASSIUM CHLORIDE 10 MEQ/100ML IV SOLN
10.0000 meq | Freq: Once | INTRAVENOUS | Status: AC
  Administered 2012-12-21: 10 meq via INTRAVENOUS
  Filled 2012-12-21: qty 100

## 2012-12-21 MED ORDER — BUTALBITAL-APAP-CAFFEINE 50-325-40 MG PO TABS: 1.0000 | ORAL_TABLET | Freq: Four times a day (QID) | ORAL | Status: DC | PRN

## 2012-12-21 NOTE — ED Provider Notes (Signed)
History    CSN: [id number] Arrival date & time 12/21/12  0032  First MD Initiated Contact with Patient 12/21/12 0107     Chief Complaint  Patient presents with  . Syncope    (Consider location/radiation/quality/duration/timing/severity/associated sxs/prior Treatment) HPI  [name] [name] is a 42 y.o. male with PMH of HTN BIBEMS for sycope/AMS responsive to sternal rub and ammonia PTA. Pt's wife states that they were having a heated stressfull discussion and when the Pt left the room to take his HTN meds they heard a crash and found him on the floor (~30 minutes PTA). Wife deneis drug , alchhol use. Level V caveat secondary to AMS  Past Medical History  Diagnosis Date  . Gallstone   . Chronic headaches   . Nephrolithiasis   . Hypertension    Past Surgical History  Procedure Laterality Date  . Appendectomy    . Cholecystectomy    . Laparotomy     Family History  Problem Relation Age of Onset  . Arthritis    . Diabetes    . Cancer Neg Hx   . Depression Neg Hx   . Early death Neg Hx   . Heart disease Neg Hx   . Hypertension Neg Hx   . Kidney disease Neg Hx   . Hyperlipidemia Neg Hx   . Stroke Neg Hx    History  Substance Use Topics  . Smoking status: Former Smoker    Quit date: 05/31/1994  . Smokeless tobacco: Never Used  . Alcohol Use: No    Review of Systems  Unable to perform ROS: Mental status change    Allergies  Review of patient's allergies indicates no known allergies.  Home Medications   Current Outpatient Rx  Name  Route  Sig  Dispense  Refill  . Beclomethasone Dipropionate (QNASL) 80 MCG/ACT AERS   Nasal   Place 4 puffs into the nose daily.   8.7 g   11   . nebivolol (BYSTOLIC) 5 MG tablet   Oral   Take 1 tablet (5 mg total) by mouth daily.   90 tablet   3   . potassium chloride SA (K-DUR,KLOR-CON) 20 MEQ tablet   Oral   Take 1 tablet (20 mEq total) by mouth 2 (two) times daily.   60 tablet   3    BP 130/71  Pulse 64   Temp(Src) 99 F (37.2 C) (Oral)  Resp 21  SpO2 95% Physical Exam  Nursing note and vitals reviewed. Constitutional: He is oriented to person, place, and time. He appears well-developed and well-nourished. No distress.  HENT:  Head: Normocephalic.  Eyes: Conjunctivae and EOM are normal. Pupils are equal, round, and reactive to light.  Cardiovascular: Normal rate, regular rhythm and intact distal pulses.   Pulmonary/Chest: Effort normal and breath sounds normal. No stridor. No respiratory distress. He has no wheezes. He has no rales. He exhibits no tenderness.  Abdominal: Soft. Bowel sounds are normal. He exhibits no distension and no mass. There is no tenderness. There is no rebound and no guarding.  Musculoskeletal: Normal range of motion.  Neurological: He is alert and oriented to person, place, and time.  responsive to pain. When asked his name pt responds in very low volume voice. No facial asymetry. PERRL. Follows commands, RUE 1/5 strength, Bilateral lower and LUE 3/5   Psychiatric: He has a normal mood and affect.    ED Course  Procedures (including critical care time) Labs Reviewed  CBC WITH DIFFERENTIAL -  Abnormal; Notable for the following:    MCHC 36.9 (*)    All other components within normal limits  COMPREHENSIVE METABOLIC PANEL - Abnormal; Notable for the following:    Potassium 3.2 (*)    Glucose, Bld 110 (*)    All other components within normal limits  URINALYSIS, ROUTINE W REFLEX MICROSCOPIC - Abnormal; Notable for the following:    APPearance CLOUDY (*)    All other components within normal limits  GLUCOSE, CAPILLARY - Abnormal; Notable for the following:    Glucose-Capillary 115 (*)    All other components within normal limits  POCT I-STAT 3, BLOOD GAS (G3+) - Abnormal; Notable for the following:    pO2, Arterial 74.0 (*)    Bicarbonate 26.7 (*)    All other components within normal limits  POCT I-STAT, CHEM 8 - Abnormal; Notable for the following:     Potassium 3.2 (*)    Glucose, Bld 108 (*)    All other components within normal limits  PROTIME-INR  AMMONIA  URINE RAPID DRUG SCREEN (HOSP PERFORMED)  ETHANOL  POCT I-STAT TROPONIN I   No results found.   Date: 12/21/2012  Rate: 61  Rhythm: normal sinus rhythm  QRS Axis: normal  Intervals: normal  ST/T Wave abnormalities: normal  Conduction Disutrbances:none  Narrative Interpretation:   Old EKG Reviewed: unchanged   1. Complicated migraine     MDM   Filed Vitals:   12/21/12 0039  BP: 130/71  Pulse: 64  Temp: 99 F (37.2 C)  TempSrc: Oral  Resp: 21  SpO2: 95%     [name] [name] is a 42 y.o. male  With syncope, lethargy and Focal RUE strength deficit. Code stroke called at 0128. Pt is inside the tPA window. Question true CVA as pattern of deficit is atypical.  Bloodwork, UDS head and neck CT normal.   Neurology consult from Dr. Amada Jupiter appreciated: He has put in orders to treated fr complicated migraine. Plan to evaluate for improvement of weakness.   Pt strength and level of alertness steadily and rapidly improved in ED. D/w Dr. Amada Jupiter and attending: we feel this is likely a complicated migraine and pt is appropriate for dc.   This is a shared visit with the attending physician who personally evaluated the patient and agrees with the care plan.    Medications  0.9 %  sodium chloride infusion (not administered)    Pt is hemodynamically stable, appropriate for, and amenable to discharge at this time. Pt verbalized understanding and agrees with care plan. Outpatient follow-up and specific return precautions discussed.    Discharge Medication List as of 12/21/2012  5:18 AM    START taking these medications   Details  butalbital-acetaminophen-caffeine (FIORICET) 50-325-40 MG per tablet Take 1 tablet by mouth every 6 (six) hours as needed for headache., Starting 12/21/2012, Until Discontinued, [employer], PA-C 12/23/12 [phone]

## 2012-12-21 NOTE — Consult Note (Signed)
Neurology Consultation Reason for Consult: Right-sided weakness Referring Physician: Molpus, J  CC: Right-sided weakness  History is obtained from: Wife, patient  HPI: [name] [name] is a 42 y.o. male who was complaining of a headache earlier around 11:30 PM. Following that he went in to take his blood pressure medicines and then his wife found him on the floor. EMS found him complaining of chest pain as well as headache and not very responsive.   A code stroke was called after he arrived here, and he was found to have some right-sided weakness.  Of note he has daily migraines   ROS: A 14 point ROS was performed and is negative except as noted in the HPI.  Past Medical History  Diagnosis Date  . Gallstone   . Chronic headaches   . Nephrolithiasis   . Hypertension     Family History: No history of stroke  Social History: Tob: Former smoker  Exam: Current vital signs: BP 132/79  Pulse 63  Temp(Src) 99 F (37.2 C) (Oral)  Resp 21  SpO2 97% Vital signs in last 24 hours: Temp:  [99 F (37.2 C)] 99 F (37.2 C) (07/24 0039) Pulse Rate:  [63-70] 63 (07/24 0100) Resp:  [21-22] 21 (07/24 0100) BP: (130-138)/(71-79) 132/79 mmHg (07/24 0100) SpO2:  [95 %-97 %] 97 % (07/24 0100)  General: In bed, eyes closed with tears CV: Regular rate and rhythm Mental Status: Patient is awake, alert, oriented to person,  month, year, and situation. knows hospital but not [place] His speech pattern is that of a very hypophonic speech, but he is able to name all the objects as well as repeat. He takes increased time to respond, however when asking about medical history response without delay Cranial Nerves: II: Visual Fields are full. Pupils are equal, round, and reactive to light.  Discs are difficult to visualize. III,IV, VI: EOMI without ptosis or diploplia.  V: Facial sensation is symmetric to temperature VII: Facial movement is symmetric.  VIII: hearing is intact to voice X:  Uvula elevates symmetrically XI: Shoulder shrug is symmetric. XII: tongue is midline without atrophy or fasciculations.  Motor: Tone is normal. Bulk is normal. 5/5 strength was present on the left side. Right side is initially flaccid, however when positioned over his face his right arm remains in the air. With repeated prompting he does display some strength, with give way weakness. Likewise, initially he is unable to keep his ankle off the bed but with repeated prompting he does hold his ankle off the bed with his knee supported. Sensory: Reports decrease sensation on the right Deep Tendon Reflexes: 2+ and symmetric in the biceps and patellae.  Plantars: Toes are downgoing bilaterally.  Cerebellar: Unable to comply on the right side Gait: Not assessed due to acute nature of evaluation and multiple medical monitors in ED setting.   I have reviewed labs in epic and the results pertinent to this consultation are: Chem 8 low potassium  I have reviewed the images obtained: CT head-negative  Impression: 42 year old male presenting with right-sided weakness. He has multiple findings on his exam that are suspicious for a psychogenic etiology to his symptoms. His speech pattern, inconsistent exam, give way weakness are all suggestive of a psychogenic etiology. The sudden onset of headache prior to symptoms would also make me more concerned for an increased risk with TPA. 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  Also  possible, but less likely would be complicated migraine given the headache. It would be reasonable to treat as a complicated migraine, and if his symptoms resolve with treatment and I do not feel that further evaluation is necessary.  If his symptoms do not resolve with headache treatment, then there is a small possibility of functional overlay on top of real symptoms and at that point I do feel that an MRI  would be prudent  Recommendations: 1) migraine treatment, could use Compazine 10 mg plus Toradol 30 mg 2) if symptoms resolved and no further workup necessary 3) if symptoms do not resolve then admission for an MRI in the morning would be indicated   Ritta Slot, MD [facility] [phone]  If 7pm- 7am, please page neurology on call at [phone].

## 2012-12-21 NOTE — ED Notes (Signed)
This nurse went in to assess patient. Pt. Neuro exam was abnormal and [name] [name], [place] notified to assess patient further.

## 2012-12-21 NOTE — Code Documentation (Signed)
Patient in normal state of health when he was involved in a 'very stressful family discussion' per patient's wife around 2330 when he reported having a headache and went to take his BP meds. Patient was found on floor by wife and not responding. EMS called to scene and patient complained of headache and chest pain 10/10. Patient received 2 nitro and 4 baby ASA by EMS. Patient arrived to [facility] at 0032. Code stroke called at 0133, LKW 2330, EDP exam at 0125, stroke team arrived at 0137, neurologist arrived at 0137, patient arrived in CT at 0130, phlebotomist arrived at 0108, CT read by Dr. Amada Jupiter at [phone]. Initial NIH 9, poor effort on right side, gradually NIH score changed to 6, and then 5 (see doc flowsheet). Patient has right sided weakness and inconsistent speech pattern. Patient has history of migraines. Will continue to monitor.

## 2012-12-21 NOTE — ED Notes (Signed)
Pt ambulated around hallway in PodA with slow steady gait, no distress noted. Pt denies dizziness but states "a little lightheaded."

## 2012-12-21 NOTE — ED Notes (Signed)
PER EMS: pt from home, was having a "very stressful family discussion" per wife statement. Reportedly the patient was complaining of a HA and walked off to take BP meds. Wife went inside and found him lying on the floor not responsive. BP: 130s/80s. Was given 2 nitro and 4 baby aspirin PTA. Also complains of left sided chest pain and HA 10/10. Pt seems lethargic on arrival, not speaking, but per ems pt responds to sternal rub and ammonia and clenches eye shut during pupil assessments.

## 2012-12-22 ENCOUNTER — Other Ambulatory Visit (INDEPENDENT_AMBULATORY_CARE_PROVIDER_SITE_OTHER): Payer: 59

## 2012-12-22 ENCOUNTER — Ambulatory Visit (INDEPENDENT_AMBULATORY_CARE_PROVIDER_SITE_OTHER): Payer: 59 | Admitting: Internal Medicine

## 2012-12-22 ENCOUNTER — Encounter: Payer: Self-pay | Admitting: Internal Medicine

## 2012-12-22 VITALS — BP 128/90 | HR 63 | Temp 98.3°F | Resp 16 | Wt 262.2 lb

## 2012-12-22 DIAGNOSIS — R531 Weakness: Secondary | ICD-10-CM

## 2012-12-22 DIAGNOSIS — M549 Dorsalgia, unspecified: Secondary | ICD-10-CM

## 2012-12-22 DIAGNOSIS — E876 Hypokalemia: Secondary | ICD-10-CM

## 2012-12-22 DIAGNOSIS — M6281 Muscle weakness (generalized): Secondary | ICD-10-CM

## 2012-12-22 DIAGNOSIS — K644 Residual hemorrhoidal skin tags: Secondary | ICD-10-CM

## 2012-12-22 DIAGNOSIS — E669 Obesity, unspecified: Secondary | ICD-10-CM

## 2012-12-22 DIAGNOSIS — K219 Gastro-esophageal reflux disease without esophagitis: Secondary | ICD-10-CM

## 2012-12-22 DIAGNOSIS — I10 Essential (primary) hypertension: Secondary | ICD-10-CM

## 2012-12-22 LAB — BASIC METABOLIC PANEL
BUN: 7 mg/dL (ref 6–23)
Chloride: 101 mEq/L (ref 96–112)
GFR: 96.92 mL/min (ref >60.00)
Potassium: 3.6 mEq/L (ref 3.5–5.1)
Sodium: 138 mEq/L (ref 135–145)

## 2012-12-22 LAB — CORTISOL: Cortisol, Plasma: 4.5 ug/dL

## 2012-12-22 LAB — MAGNESIUM: Magnesium: 2 mg/dL (ref 1.5–2.5)

## 2012-12-22 MED ORDER — DEXLANSOPRAZOLE 60 MG PO CPDR: 60.0000 mg | DELAYED_RELEASE_CAPSULE | Freq: Every day | ORAL | Status: DC

## 2012-12-22 MED ORDER — TRAMADOL HCL 50 MG PO TABS: 50.0000 mg | ORAL_TABLET | Freq: Three times a day (TID) | ORAL | Status: DC | PRN

## 2012-12-22 MED ORDER — POTASSIUM CHLORIDE CRYS ER 20 MEQ PO TBCR: 20.0000 meq | EXTENDED_RELEASE_TABLET | Freq: Two times a day (BID) | ORAL | Status: DC

## 2012-12-22 NOTE — Progress Notes (Signed)
Subjective:    Patient ID: [name] [name], male    DOB: 1970/09/17, 42 y.o.   MRN: [id number]  Gastrophageal Reflux He complains of heartburn. He reports no abdominal pain, no belching, no chest pain, no choking, no coughing, no dysphagia, no early satiety, no globus sensation, no hoarse voice, no nausea, no sore throat, no stridor, no tooth decay, no water brash or no wheezing. This is a new problem. The current episode started 1 to 4 weeks ago. The problem occurs frequently. The problem has been gradually worsening. The heartburn duration is an hour. The heartburn is located in the substernum. The heartburn is of moderate intensity. The heartburn wakes him from sleep. The heartburn does not limit his activity. The heartburn doesn't change with position. The symptoms are aggravated by certain foods. Pertinent negatives include no anemia, fatigue, melena, muscle weakness, orthopnea or weight loss. Risk factors include NSAIDs, obesity and lack of exercise. He has tried nothing for the symptoms. The treatment provided no relief.      Review of Systems  Constitutional: Negative.  Negative for fever, chills, weight loss, diaphoresis, activity change, appetite change, fatigue and unexpected weight change.  HENT: Negative.  Negative for sore throat and hoarse voice.   Eyes: Negative.   Respiratory: Negative.  Negative for apnea, cough, choking, shortness of breath, wheezing and stridor.   Cardiovascular: Negative for chest pain, palpitations and leg swelling.  Gastrointestinal: Positive for heartburn and anal bleeding. Negative for dysphagia, nausea, vomiting, abdominal pain, diarrhea, constipation, melena, abdominal distention and rectal pain.  Endocrine: Negative.   Genitourinary: Negative.   Musculoskeletal: Positive for back pain. Negative for myalgias, joint swelling, arthralgias, gait problem and muscle weakness.  Skin: Negative.  Negative for color change, pallor, rash and wound.   Allergic/Immunologic: Negative.   Neurological: Negative.  Negative for dizziness, tremors, seizures, syncope, facial asymmetry, speech difficulty, weakness, light-headedness, numbness and headaches.  Hematological: Negative.  Negative for adenopathy. Does not bruise/bleed easily.  Psychiatric/Behavioral: Negative.        Objective:   Physical Exam  Vitals reviewed. Constitutional: He is oriented to person, place, and time. He appears well-developed and well-nourished. No distress.  HENT:  Head: Normocephalic and atraumatic.  Mouth/Throat: Oropharynx is clear and moist. No oropharyngeal exudate.  Eyes: Conjunctivae are normal. Right eye exhibits no discharge. Left eye exhibits no discharge. No scleral icterus.  Neck: Normal range of motion. Neck supple. No JVD present. No tracheal deviation present. No thyromegaly present.  Cardiovascular: Normal rate, regular rhythm, normal heart sounds and intact distal pulses.  Exam reveals no gallop and no friction rub.   No murmur heard. Pulmonary/Chest: Effort normal and breath sounds normal. No stridor. No respiratory distress. He has no wheezes. He has no rales. He exhibits no tenderness.  Abdominal: Soft. Bowel sounds are normal. He exhibits no distension and no mass. There is no tenderness. There is no rebound and no guarding. Hernia confirmed negative in the right inguinal area and confirmed negative in the left inguinal area.  Genitourinary: Prostate normal, testes normal and penis normal. Rectal exam shows external hemorrhoid. Rectal exam shows no internal hemorrhoid, no fissure, no mass, no tenderness and anal tone normal. Guaiac negative stool. Prostate is not enlarged and not tender. Right testis shows no mass, no swelling and no tenderness. Right testis is descended. Left testis shows no mass, no swelling and no tenderness. Left testis is descended. Circumcised. No penile erythema or penile tenderness. No discharge found.  Musculoskeletal:  Normal range of  motion. He exhibits no edema and no tenderness.  Lymphadenopathy:    He has no cervical adenopathy.       Right: No inguinal adenopathy present.       Left: No inguinal adenopathy present.  Neurological: He is alert and oriented to person, place, and time. He has normal reflexes. He displays normal reflexes. No cranial nerve deficit. He exhibits normal muscle tone. Coordination normal.  Skin: Skin is warm and dry. No rash noted. He is not diaphoretic. No erythema. No pallor.  Psychiatric: He has a normal mood and affect. His behavior is normal. Judgment and thought content normal.     Lab Results  Component Value Date   WBC 8.2 12/21/2012   HGB 14.6 12/21/2012   HCT 43.0 12/21/2012   PLT 330 12/21/2012   GLUCOSE 108* 12/21/2012   CHOL 181 08/13/2011   TRIG 123.0 08/13/2011   HDL 40.60 08/13/2011   LDLDIRECT 108.7 10/09/2009   LDLCALC 116* 08/13/2011   ALT 22 12/21/2012   AST 21 12/21/2012   NA 139 12/21/2012   K 3.2* 12/21/2012   CL 102 12/21/2012   CREATININE 1.10 12/21/2012   BUN 10 12/21/2012   CO2 28 12/21/2012   TSH 1.78 08/13/2011   INR 0.91 12/21/2012   HGBA1C 5.8 06/16/2012       Assessment & Plan:

## 2012-12-22 NOTE — Patient Instructions (Signed)
Gastroesophageal Reflux Disease, Adult  Gastroesophageal reflux disease (GERD) happens when acid from your stomach flows up into the esophagus. When acid comes in contact with the esophagus, the acid causes soreness (inflammation) in the esophagus. Over time, GERD may create small holes (ulcers) in the lining of the esophagus.  CAUSES   · Increased body weight. This puts pressure on the stomach, making acid rise from the stomach into the esophagus.  · Smoking. This increases acid production in the stomach.  · Drinking alcohol. This causes decreased pressure in the lower esophageal sphincter (valve or ring of muscle between the esophagus and stomach), allowing acid from the stomach into the esophagus.  · Late evening meals and a full stomach. This increases pressure and acid production in the stomach.  · A malformed lower esophageal sphincter.  Sometimes, no cause is found.  SYMPTOMS   · Burning pain in the lower part of the mid-chest behind the breastbone and in the mid-stomach area. This may occur twice a week or more often.  · Trouble swallowing.  · Sore throat.  · Dry cough.  · Asthma-like symptoms including chest tightness, shortness of breath, or wheezing.  DIAGNOSIS   Your caregiver may be able to diagnose GERD based on your symptoms. In some cases, X-rays and other tests may be done to check for complications or to check the condition of your stomach and esophagus.  TREATMENT   Your caregiver may recommend over-the-counter or prescription medicines to help decrease acid production. Ask your caregiver before starting or adding any new medicines.   HOME CARE INSTRUCTIONS   · Change the factors that you can control. Ask your caregiver for guidance concerning weight loss, quitting smoking, and alcohol consumption.  · Avoid foods and drinks that make your symptoms worse, such as:  · Caffeine or alcoholic drinks.  · Chocolate.  · Peppermint or mint flavorings.  · Garlic and onions.  · Spicy foods.  · Citrus fruits,  such as oranges, lemons, or limes.  · Tomato-based foods such as sauce, chili, salsa, and pizza.  · Fried and fatty foods.  · Avoid lying down for the 3 hours prior to your bedtime or prior to taking a nap.  · Eat small, frequent meals instead of large meals.  · Wear loose-fitting clothing. Do not wear anything tight around your waist that causes pressure on your stomach.  · Raise the head of your bed 6 to 8 inches with wood blocks to help you sleep. Extra pillows will not help.  · Only take over-the-counter or prescription medicines for pain, discomfort, or fever as directed by your caregiver.  · Do not take aspirin, ibuprofen, or other nonsteroidal anti-inflammatory drugs (NSAIDs).  SEEK IMMEDIATE MEDICAL CARE IF:   · You have pain in your arms, neck, jaw, teeth, or back.  · Your pain increases or changes in intensity or duration.  · You develop nausea, vomiting, or sweating (diaphoresis).  · You develop shortness of breath, or you faint.  · Your vomit is green, yellow, black, or looks like coffee grounds or blood.  · Your stool is red, bloody, or black.  These symptoms could be signs of other problems, such as heart disease, gastric bleeding, or esophageal bleeding.  MAKE SURE YOU:   · Understand these instructions.  · Will watch your condition.  · Will get help right away if you are not doing well or get worse.  Document Released: 02/24/2005 Document Revised: 08/09/2011 Document Reviewed: 12/04/2010  [employer]® Patient   Information ©2014 [employer].

## 2012-12-23 NOTE — Assessment & Plan Note (Signed)
He needs to stop nsaids for now Will control the pain with tramadol

## 2012-12-23 NOTE — ED Provider Notes (Signed)
Medical screening examination/treatment/procedure(s) were conducted as a shared visit with non-physician practitioner(s) and myself.  I personally evaluated the patient during the encounter  Fluttering eyelashes. Apparent right hemiplegia. Will call a Code Stroke.   Hanley Seamen, MD 12/23/12 2238

## 2012-12-23 NOTE — Assessment & Plan Note (Signed)
He has tried but has not been able to lose weight

## 2012-12-23 NOTE — Assessment & Plan Note (Signed)
This has resolved.

## 2012-12-23 NOTE — Assessment & Plan Note (Signed)
I have asked him to see General Surgery about this

## 2012-12-23 NOTE — Assessment & Plan Note (Signed)
He will stop the nsaids and will avoid spicy foods Will treat with dexilant

## 2012-12-23 NOTE — Assessment & Plan Note (Signed)
His BP is adequately well controlled 

## 2012-12-23 NOTE — Assessment & Plan Note (Signed)
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

## 2012-12-25 ENCOUNTER — Telehealth: Payer: Self-pay

## 2012-12-25 ENCOUNTER — Ambulatory Visit (INDEPENDENT_AMBULATORY_CARE_PROVIDER_SITE_OTHER): Payer: 59 | Admitting: General Surgery

## 2012-12-25 ENCOUNTER — Telehealth (INDEPENDENT_AMBULATORY_CARE_PROVIDER_SITE_OTHER): Payer: Self-pay

## 2012-12-25 ENCOUNTER — Encounter (INDEPENDENT_AMBULATORY_CARE_PROVIDER_SITE_OTHER): Payer: Self-pay | Admitting: General Surgery

## 2012-12-25 VITALS — BP 150/84 | HR 60 | Temp 97.6°F | Resp 16 | Ht 70.0 in | Wt 262.6 lb

## 2012-12-25 DIAGNOSIS — K648 Other hemorrhoids: Secondary | ICD-10-CM

## 2012-12-25 DIAGNOSIS — K219 Gastro-esophageal reflux disease without esophagitis: Secondary | ICD-10-CM

## 2012-12-25 DIAGNOSIS — K644 Residual hemorrhoidal skin tags: Secondary | ICD-10-CM

## 2012-12-25 MED ORDER — HYDROCORTISONE ACETATE 25 MG RE SUPP: 25.0000 mg | Freq: Every evening | RECTAL | Status: DC | PRN

## 2012-12-25 NOTE — Telephone Encounter (Signed)
Patient called [facility] requesting to pick up a work note covering today 12/25/12. Returned call back to pt whom will pick up this afternoon.

## 2012-12-25 NOTE — Telephone Encounter (Signed)
I left a message for Dr Yetta Barre' CMA.  We are seeing this pt today and he said Dr Yetta Barre is supposed to refer him to a GI for an endoscopy.  Dr Maisie Fus thinks he needs a colonoscopy too.  I want to see who they are referring to so they can do them both at the same time.  Await her call back.

## 2012-12-25 NOTE — Telephone Encounter (Signed)
I assume he will see the GI doctors upstairs, he has been referred

## 2012-12-25 NOTE — Progress Notes (Signed)
Chief Complaint  Patient presents with  . New Evaluation    eval ext hems    HISTORY: [name] [name] is a 42 y.o. male who presents to the office with rectal bleeding.  Other symptoms include nothing.  This had been occurring for a few weeks.  He has tried nothing in the past.  Nothing makes the symptoms worse.   It is continuous in nature with every BM.  His bowel habits are regular and his bowel movements are soft to loose.  His fiber intake is dietary.  He has never had a colonoscopy.  He denies prolapsing tissue.     Past Medical History  Diagnosis Date  . Gallstone   . Chronic headaches   . Nephrolithiasis   . Hypertension       Past Surgical History  Procedure Laterality Date  . Appendectomy    . Cholecystectomy    . Laparotomy          Current Outpatient Prescriptions  Medication Sig Dispense Refill  . Beclomethasone Dipropionate (QNASL) 80 MCG/ACT AERS Place 4 puffs into the nose daily.  8.7 g  11  . butalbital-acetaminophen-caffeine (FIORICET) 50-325-40 MG per tablet Take 1 tablet by mouth every 6 (six) hours as needed for headache.  15 tablet  0  . dexlansoprazole (DEXILANT) 60 MG capsule Take 1 capsule (60 mg total) by mouth daily.  90 capsule  3  . nebivolol (BYSTOLIC) 5 MG tablet Take 1 tablet (5 mg total) by mouth daily.  90 tablet  3  . potassium chloride SA (K-DUR,KLOR-CON) 20 MEQ tablet Take 1 tablet (20 mEq total) by mouth 2 (two) times daily.  60 tablet  3  . traMADol (ULTRAM) 50 MG tablet Take 1 tablet (50 mg total) by mouth every 8 (eight) hours as needed for pain.  90 tablet  3   No current facility-administered medications for this visit.      No Known Allergies    Family History  Problem Relation Age of Onset  . Arthritis    . Diabetes    . Cancer Neg Hx   . Depression Neg Hx   . Early death Neg Hx   . Heart disease Neg Hx   . Hypertension Neg Hx   . Kidney disease Neg Hx   . Hyperlipidemia Neg Hx   . Stroke Neg Hx     History   Social  History  . Marital Status: Married    Spouse Name: N/A    Number of Children: N/A  . Years of Education: N/A   Social History Main Topics  . Smoking status: Former Smoker    Quit date: 05/31/1994  . Smokeless tobacco: Never Used  . Alcohol Use: No  . Drug Use: No  . Sexually Active: Yes   Other Topics Concern  . None   Social History Narrative   Occupation: [employer]   Regular Exercise-yes                  REVIEW OF SYSTEMS - PERTINENT POSITIVES ONLY: Review of Systems - General ROS: negative for - chills, fever or weight loss Hematological and Lymphatic ROS: negative for - bleeding problems, blood clots or bruising Respiratory ROS: no cough, shortness of breath, or wheezing Cardiovascular ROS: no chest pain or dyspnea on exertion Gastrointestinal ROS: positive for - abdominal pain, blood in stools and Reflux negative for - change in bowel habits, change in stools or constipation Genito-Urinary ROS: no dysuria, trouble voiding, or hematuria  EXAM: Filed Vitals:   12/25/12 1128  BP: 150/84  Pulse: 60  Temp: 97.6 F (36.4 C)  Resp: 16    General appearance: alert and cooperative Resp: clear to auscultation bilaterally Cardio: regular rate and rhythm GI: soft, non-tender; bowel sounds normal; no masses,  no organomegaly   Procedure: Anoscopy Surgeon: Maisie Fus Diagnosis: rectal bleeding  Assistant: Christella Scheuermann After the risks and benefits were explained, verbal consent was obtained for above procedure  Anesthesia: none Findings: mildly inflamed, grade 2 L lateral hemorrhoid, no active bleeding, L lateral skin tag    ASSESSMENT AND PLAN: CONNELLY NETTERVILLE is a 42 y.o. M with rectal bleeding for the past few weeks.  On exam he has a mildly inflamed hemorrhoid.  This may be the cause of his bleeding, but given his age and occasional abd pain, I think he should get a colonoscopy to rule out any other source of bleeding.  He tells me that he is being referred to a GI  doctor for GERD, and so we are contacting his PCP to see who this is, so that we can refer to the same person.  I have given him an anusol suppository and instructions for fiber supplementation to help with his hemorrhoidal disease.  He will call back if his symptoms persist.    Vanita Panda, MD Colon and Rectal Surgery / General Surgery [facility]      Visit Diagnoses: No diagnosis found.  Primary Care Physician: Sanda Linger, MD

## 2012-12-25 NOTE — Patient Instructions (Addendum)
HEMORRHOIDS    Did you know... Hemorrhoids are one of the most common ailments known.  More than half the population will develop hemorrhoids, usually after age 43.  Millions of Americans currently suffer from hemorrhoids.  The average person suffers in silence for a long period before seeking medical care.  Today's treatment methods make some types of hemorrhoid removal much less painful.  What are hemorrhoids? Often described as "varicose veins of the anus and rectum", hemorrhoids are enlarged, bulging blood vessels in and about the anus and lower rectum. There are two types of hemorrhoids: external and internal, which refer to their location.  External (outside) hemorrhoids develop near the anus and are covered by very sensitive skin. These are usually painless. However, if a blood clot (thrombosis) develops in an external hemorrhoid, it becomes a painful, hard lump. The external hemorrhoid may bleed if it ruptures. Internal (inside) hemorrhoids develop within the anus beneath the lining. Painless bleeding and protrusion during bowel movements are the most common symptom. However, an internal hemorrhoid can cause severe pain if it is completely "prolapsed" - protrudes from the anal opening and cannot be pushed back inside.   What causes hemorrhoids? An exact cause is unknown; however, the upright posture of humans alone forces a great deal of pressure on the rectal veins, which sometimes causes them to bulge. Other contributing factors include:  . Aging  . Chronic constipation or diarrhea  . Pregnancy  . Heredity  . Straining during bowel movements  . Faulty bowel function due to overuse of laxatives or enemas . Spending long periods of time (e.g., reading) on the toilet  Whatever the cause, the tissues supporting the vessels stretch. As a result, the vessels dilate; their walls become thin and bleed. If the stretching and pressure continue, the weakened vessels protrude.  What are the  symptoms? If you notice any of the following, you could have hemorrhoids:  . Bleeding during bowel movements  . Protrusion during bowel movements . Itching in the anal area  . Pain  . Sensitive lump(s)  How are hemorrhoids treated? Mild symptoms can be relieved frequently by increasing the amount of fiber (e.g., fruits, vegetables, breads and cereals) and fluids in the diet. Eliminating excessive straining reduces the pressure on hemorrhoids and helps prevent them from protruding. A sitz bath - sitting in plain warm water for about 10 minutes - can also provide some relief . With these measures, the pain and swelling of most symptomatic hemorrhoids will decrease in two to seven days, and the firm lump should recede within four to six weeks. In cases of severe or persistent pain from a thrombosed hemorrhoid, your physician may elect to remove the hemorrhoid containing the clot with a small incision. Performed under local anesthesia as an outpatient, this procedure generally provides relief. Severe hemorrhoids may require special treatment, much of which can be performed on an outpatient basis.  . Ligation - the rubber band treatment - works effectively on internal hemorrhoids that protrude with bowel movements. A small rubber band is placed over the hemorrhoid, cutting off its blood supply. The hemorrhoid and the band fall off in a few days and the wound usually heals in a week or two. This procedure sometimes produces mild discomfort and bleeding and may need to be repeated for a full effect.  There is a more intense version of this procedure that is done in the OR as outpatient surgery called THD.  It involves identifying blood vessels leading to the   hemorrhoids and then tying them off with sutures.  This method is a little more painful than rubber band ligation but less painful than traditional hemorrhoidectomy and usually does not have to be repeated.  It is best for internal hemorrhoids that  bleed.  Rubber Band Ligation of Internal Hemorrhoids:  A.  Bulging, bleeding, internal hemorrhoid B.  Rubber band applied at the base of the hemorrhoid C.  About 7 days later, the banded hemorrhoid has fallen off leaving a small scar (arrow)  . Injection and Coagulation can also be used on bleeding hemorrhoids that do not protrude. Both methods are relatively painless and cause the hemorrhoid to shrivel up. . Hemorrhoidectomy - surgery to remove the hemorrhoids - is the most complete method for removal of internal and external hemorrhoids. It is necessary when (1) clots repeatedly form in external hemorrhoids; (2) ligation fails to treat internal hemorrhoids; (3) the protruding hemorrhoid cannot be reduced; or (4) there is persistent bleeding. A hemorrhoidectomy removes excessive tissue that causes the bleeding and protrusion. It is done under anesthesia using sutures, and may, depending upon circumstances, require hospitalization and a period of inactivity. Laser hemorrhoidectomies do not offer any advantage over standard operative techniques. They are also quite expensive, and contrary to popular belief, are no less painful.  Do hemorrhoids lead to cancer? No. There is no relationship between hemorrhoids and cancer. However, the symptoms of hemorrhoids, particularly bleeding, are similar to those of colorectal cancer and other diseases of the digestive system. Therefore, it is important that all symptoms are investigated by a physician specially trained in treating diseases of the colon and rectum and that everyone 50 years or older undergo screening tests for colorectal cancer. Do not rely on over-the-counter medications or other self-treatments. See a colorectal surgeon first so your symptoms can be properly evaluated and effective treatment prescribed.  2012 American Society of Colon & Rectal Surgeons    Fiber Chart  You should 25-30g of fiber per day and drinking 8 glasses of water to help  your bowels move regularly.  In the chart below you can look up how much fiber you are getting in an average day.  If you are not getting enough fiber, you should add a fiber supplement to your diet.  Examples of this include Metamucil, FiberCon and Citrucel.  These can be purchased at your local grocery store or pharmacy.      http://www.[employer].edu/offices/health/nutritioncoach/AtoZ/handouts/Fiber.pdf    GETTING TO GOOD BOWEL HEALTH. Irregular bowel habits such as constipation can lead to many problems over time.  Having one soft bowel movement a day is the most important way to prevent further problems.  The anorectal canal is designed to handle stretching and feces to safely manage our ability to get rid of solid waste (feces, poop, stool) out of our body.  BUT, hard constipated stools can act like ripping concrete bricks causing inflamed hemorrhoids, anal fissures, abdominal pain and bloating.     The goal: ONE SOFT BOWEL MOVEMENT A DAY!  To have soft, regular bowel movements:    Drink at least 8 tall glasses of water a day.     Take plenty of fiber.  Fiber is the undigested part of plant food that passes into the colon, acting s "natures broom" to encourage bowel motility and movement.  Fiber can absorb and hold large amounts of water. This results in a larger, bulkier stool, which is soft and easier to pass. Work gradually over several weeks up to 6 servings a   day of fiber (25g a day even more if needed) in the form of: o Vegetables -- Root (potatoes, carrots, turnips), leafy green (lettuce, salad greens, celery, spinach), or cooked high residue (cabbage, broccoli, etc) o Fruit -- Fresh (unpeeled skin & pulp), Dried (prunes, apricots, cherries, etc ),  or stewed ( applesauce)  o Whole grain breads, pasta, etc (whole wheat)  o Bran cereals    Bulking Agents -- This type of water-retaining fiber generally is easily obtained each day by one of the following:  o Psyllium bran -- The psyllium  plant is remarkable because its ground seeds can retain so much water. This product is available as Metamucil, Konsyl, Effersyllium, Per Diem Fiber, or the less expensive generic preparation in drug and health food stores. Although labeled a laxative, it really is not a laxative.  o Methylcellulose -- This is another fiber derived from wood which also retains water. It is available as Citrucel. o Polyethylene Glycol - and "artificial" fiber commonly called Miralax or Glycolax.  It is helpful for people with gassy or bloated feelings with regular fiber o Flax Seed - a less gassy fiber than psyllium   No reading or other relaxing activity while on the toilet. If bowel movements take longer than 5 minutes, you are too constipated.   AVOID CONSTIPATION.  High fiber and water intake usually takes care of this.  Sometimes a laxative is needed to stimulate more frequent bowel movements, but    Laxatives are not a good long-term solution as it can wear the colon out. o Osmotics (Milk of Magnesia, Fleets phosphosoda, Magnesium citrate, MiraLax, GoLytely) are safer than  o Stimulants (Senokot, Castor Oil, Dulcolax, Ex Lax)    o Do not take laxatives for more than 7days in a row.    IF SEVERELY CONSTIPATED, try a Bowel Retraining Program: o Do not use laxatives.  o Eat a diet high in roughage, such as bran cereals and leafy vegetables.  o Drink six (6) ounces of prune or apricot juice each morning.  o Eat two (2) large servings of stewed fruit each day.  o Take one (1) heaping tablespoon of a psyllium-based bulking agent twice a day. Use sugar-free sweetener when possible to avoid excessive calories.  o Eat a normal breakfast.  o Set aside 15 minutes after breakfast to sit on the toilet, but do not strain to have a bowel movement.  o If you do not have a bowel movement by the third day, use an enema and repeat the above steps.    

## 2012-12-25 NOTE — Telephone Encounter (Signed)
Maralyn Sago from [facility] called [facility] stating that pt is seeing Dr. Maisie Fus today and advised them that his PCP discussed setting up an endoscopy for him. Maralyn Sago states that per Dr.Prinston, pt also needs a colonoscopy and wanted to know where we planned to refer pt. Please advise on endoscopy, did not see a referral for this. Thanks

## 2012-12-26 NOTE — Telephone Encounter (Signed)
I rec'd a call from [place] and she states the pt was referred to [facility] for endoscopy.  I called them and they have no referral.  She states they need a referral for that and they can schedule the pt.  He needs an ov 1st and then they will schedule the endoscopy.  Dr Maisie Fus wants him to get a colonoscopy as well.  I left a message for Mick Sell that they need to put in a referral.

## 2012-12-26 NOTE — Telephone Encounter (Signed)
Sarah with [facility] notified per MD and given [facility] phone number and transferred.

## 2013-01-01 ENCOUNTER — Encounter: Payer: Self-pay | Admitting: Gastroenterology

## 2013-01-01 NOTE — Addendum Note (Signed)
Addended by: Etta Grandchild on: 01/01/2013 09:00 AM   Modules accepted: Orders

## 2013-01-01 NOTE — Telephone Encounter (Signed)
(  See 12/25/12 note from Dr. Maisie Fus office)- Please advise if ok to enter referral for endoscopy and colonoscopy. Thanks   Ivory Broad, RN at 12/26/2012 8:52 AM    Status: Signed             I rec'd a call from [facility] and she states the pt was referred to [facility] for endoscopy. I called them and they have no referral. She states they need a referral for that and they can schedule the pt. He needs an ov 1st and then they will schedule the endoscopy. Dr Maisie Fus wants him to get a colonoscopy as well. I left a message for Mick Sell that they need to put in a referral.

## 2013-01-15 ENCOUNTER — Encounter: Payer: Self-pay | Admitting: Internal Medicine

## 2013-01-15 ENCOUNTER — Other Ambulatory Visit (INDEPENDENT_AMBULATORY_CARE_PROVIDER_SITE_OTHER): Payer: 59

## 2013-01-15 ENCOUNTER — Ambulatory Visit (INDEPENDENT_AMBULATORY_CARE_PROVIDER_SITE_OTHER): Payer: 59 | Admitting: Internal Medicine

## 2013-01-15 VITALS — BP 126/82 | HR 66 | Temp 98.5°F | Resp 16 | Wt 268.0 lb

## 2013-01-15 DIAGNOSIS — I10 Essential (primary) hypertension: Secondary | ICD-10-CM

## 2013-01-15 DIAGNOSIS — R51 Headache: Secondary | ICD-10-CM

## 2013-01-15 DIAGNOSIS — Z0000 Encounter for general adult medical examination without abnormal findings: Secondary | ICD-10-CM

## 2013-01-15 DIAGNOSIS — E876 Hypokalemia: Secondary | ICD-10-CM

## 2013-01-15 LAB — BASIC METABOLIC PANEL
CO2: 30 mEq/L (ref 19–32)
Chloride: 101 mEq/L (ref 96–112)
Potassium: 3.9 mEq/L (ref 3.5–5.1)

## 2013-01-15 LAB — LIPID PANEL
Total CHOL/HDL Ratio: 6
Triglycerides: 382 mg/dL — ABNORMAL HIGH (ref 0.0–149.0)

## 2013-01-15 LAB — LDL CHOLESTEROL, DIRECT: Direct LDL: 98.3 mg/dL

## 2013-01-15 LAB — PSA: PSA: 0.73 ng/mL (ref 0.10–4.00)

## 2013-01-15 LAB — TSH: TSH: 3.22 u[IU]/mL (ref 0.35–5.50)

## 2013-01-15 LAB — FECAL OCCULT BLOOD, GUAIAC: Fecal Occult Blood: NEGATIVE

## 2013-01-15 NOTE — Assessment & Plan Note (Signed)
His BP is well controlled 

## 2013-01-15 NOTE — Progress Notes (Signed)
Subjective:    Patient ID: [name] [name], male    DOB: [birth year], 42 y.o.   MRN: [id number]  Headache  This is a chronic problem. The current episode started more than 1 year ago. The problem occurs intermittently. The problem has been unchanged. The pain is located in the bilateral region. The pain does not radiate. The pain quality is similar to prior headaches. The quality of the pain is described as aching. The pain is at a severity of 2/10. The pain is mild. Pertinent negatives include no abdominal pain, abnormal behavior, anorexia, back pain, blurred vision, coughing, dizziness, drainage, ear pain, eye pain, eye redness, facial sweating, fever, hearing loss, insomnia, loss of balance, muscle aches, nausea, neck pain, numbness, phonophobia, photophobia, scalp tenderness, seizures, sinus pressure, sore throat, swollen glands, tingling, tinnitus, visual change, vomiting, weakness or weight loss. Nothing aggravates the symptoms. Treatments tried: Dr Vela Prose gave him ? med that helped, he was given bupap in the ER recently. The treatment provided moderate relief. His past medical history is significant for hypertension and obesity. There is no history of cancer, cluster headaches, immunosuppression, migraine headaches, migraines in the family, pseudotumor cerebri, recent head traumas, sinus disease or TMJ.      Review of Systems  Constitutional: Negative.  Negative for fever, chills, weight loss, diaphoresis, activity change, appetite change, fatigue and unexpected weight change.  HENT: Negative for hearing loss, ear pain, sore throat, neck pain, sinus pressure and tinnitus.   Eyes: Negative.  Negative for blurred vision, photophobia, pain and redness.  Respiratory: Negative.  Negative for cough.   Cardiovascular: Negative.   Gastrointestinal: Negative.  Negative for nausea, vomiting, abdominal pain and anorexia.  Endocrine: Negative.   Genitourinary: Negative.   Musculoskeletal: Negative.   Negative for back pain.  Skin: Negative.   Allergic/Immunologic: Negative.   Neurological: Positive for headaches. Negative for dizziness, tingling, tremors, seizures, syncope, facial asymmetry, speech difficulty, weakness, light-headedness, numbness and loss of balance.  Hematological: Negative.  Negative for adenopathy. Does not bruise/bleed easily.  Psychiatric/Behavioral: Negative.  The patient does not have insomnia.        Objective:   Physical Exam  Vitals reviewed. Constitutional: He is oriented to person, place, and time. He appears well-developed and well-nourished. No distress.  HENT:  Head: Normocephalic and atraumatic.  Mouth/Throat: Oropharynx is clear and moist. No oropharyngeal exudate.  Eyes: Conjunctivae and EOM are normal. Pupils are equal, round, and reactive to light. Right eye exhibits no discharge. Left eye exhibits no discharge. No scleral icterus.  Neck: Normal range of motion. Neck supple. No JVD present. No tracheal deviation present. No thyromegaly present.  Cardiovascular: Normal rate, regular rhythm, normal heart sounds and intact distal pulses.  Exam reveals no gallop and no friction rub.   No murmur heard. Pulmonary/Chest: Effort normal and breath sounds normal. No stridor. No respiratory distress. He has no wheezes. He has no rales. He exhibits no tenderness.  Abdominal: Soft. Bowel sounds are normal. He exhibits no distension and no mass. There is no tenderness. There is no rebound and no guarding. Hernia confirmed negative in the right inguinal area and confirmed negative in the left inguinal area.  Genitourinary: Prostate normal, testes normal and penis normal. Rectal exam shows external hemorrhoid and internal hemorrhoid. Rectal exam shows no fissure, no mass, no tenderness and anal tone normal. Guaiac negative stool. Prostate is not enlarged and not tender. Right testis shows no mass, no swelling and no tenderness. Right testis is descended. Left testis  shows no mass, no swelling and no tenderness. Left testis is descended. Circumcised. No penile erythema or penile tenderness. No discharge found.  Musculoskeletal: Normal range of motion. He exhibits no edema and no tenderness.  Lymphadenopathy:    He has no cervical adenopathy.       Right: No inguinal adenopathy present.       Left: No inguinal adenopathy present.  Neurological: He is alert and oriented to person, place, and time. He has normal reflexes. He displays normal reflexes. No cranial nerve deficit. He exhibits normal muscle tone. Coordination normal.  Skin: Skin is warm and dry. No rash noted. He is not diaphoretic. No erythema. No pallor.  Psychiatric: He has a normal mood and affect. His behavior is normal. Judgment and thought content normal.     Lab Results  Component Value Date   WBC 8.2 12/21/2012   HGB 14.6 12/21/2012   HCT 43.0 12/21/2012   PLT 330 12/21/2012   GLUCOSE 94 12/22/2012   CHOL 181 08/13/2011   TRIG 123.0 08/13/2011   HDL 40.60 08/13/2011   LDLDIRECT 108.7 10/09/2009   LDLCALC 116* 08/13/2011   ALT 22 12/21/2012   AST 21 12/21/2012   NA 138 12/22/2012   K 3.6 12/22/2012   CL 101 12/22/2012   CREATININE 1.1 12/22/2012   BUN 7 12/22/2012   CO2 29 12/22/2012   TSH 1.78 08/13/2011   INR 0.91 12/21/2012   HGBA1C 5.8 06/16/2012       Assessment & Plan:

## 2013-01-15 NOTE — Assessment & Plan Note (Signed)
Exam done Vaccines were reviewed Labs ordered Pt ed material was given 

## 2013-01-15 NOTE — Assessment & Plan Note (Signed)
I will recheck his K+ level today 

## 2013-01-15 NOTE — Assessment & Plan Note (Signed)
I have asked him to stop taking Bupap due to potential for abuse, dependence, tolerance, withdrawal, and rebound headache I have asked him to f/up with Dr. Vela Prose to restart the med he was on before

## 2013-01-15 NOTE — Patient Instructions (Signed)
Health Maintenance, Males A healthy lifestyle and preventative care can promote health and wellness.  Maintain regular health, dental, and eye exams.  Eat a healthy diet. Foods like vegetables, fruits, whole grains, low-fat dairy products, and lean protein foods contain the nutrients you need without too many calories. Decrease your intake of foods high in solid fats, added sugars, and salt. Get information about a proper diet from your caregiver, if necessary.  Regular physical exercise is one of the most important things you can do for your health. Most adults should get at least 150 minutes of moderate-intensity exercise (any activity that increases your heart rate and causes you to sweat) each week. In addition, most adults need muscle-strengthening exercises on 2 or more days a week.   Maintain a healthy weight. The body mass index (BMI) is a screening tool to identify possible weight problems. It provides an estimate of body fat based on height and weight. Your caregiver can help determine your BMI, and can help you achieve or maintain a healthy weight. For adults 20 years and older:  A BMI below 18.5 is considered underweight.  A BMI of 18.5 to 24.9 is normal.  A BMI of 25 to 29.9 is considered overweight.  A BMI of 30 and above is considered obese.  Maintain normal blood lipids and cholesterol by exercising and minimizing your intake of saturated fat. Eat a balanced diet with plenty of fruits and vegetables. Blood tests for lipids and cholesterol should begin at age 20 and be repeated every 5 years. If your lipid or cholesterol levels are high, you are over 50, or you are a high risk for heart disease, you may need your cholesterol levels checked more frequently.Ongoing high lipid and cholesterol levels should be treated with medicines, if diet and exercise are not effective.  If you smoke, find out from your caregiver how to quit. If you do not use tobacco, do not start.  If you  choose to drink alcohol, do not exceed 2 drinks per day. One drink is considered to be 12 ounces (355 mL) of beer, 5 ounces (148 mL) of wine, or 1.5 ounces (44 mL) of liquor.  Avoid use of street drugs. Do not share needles with anyone. Ask for help if you need support or instructions about stopping the use of drugs.  High blood pressure causes heart disease and increases the risk of stroke. Blood pressure should be checked at least every 1 to 2 years. Ongoing high blood pressure should be treated with medicines if weight loss and exercise are not effective.  If you are 45 to 43 years old, ask your caregiver if you should take aspirin to prevent heart disease.  Diabetes screening involves taking a blood sample to check your fasting blood sugar level. This should be done once every 3 years, after age 45, if you are within normal weight and without risk factors for diabetes. Testing should be considered at a younger age or be carried out more frequently if you are overweight and have at least 1 risk factor for diabetes.  Colorectal cancer can be detected and often prevented. Most routine colorectal cancer screening begins at the age of 50 and continues through age 75. However, your caregiver may recommend screening at an earlier age if you have risk factors for colon cancer. On a yearly basis, your caregiver may provide home test kits to check for hidden blood in the stool. Use of a small camera at the end of a tube,   to directly examine the colon (sigmoidoscopy or colonoscopy), can detect the earliest forms of colorectal cancer. Talk to your caregiver about this at age 50, when routine screening begins. Direct examination of the colon should be repeated every 5 to 10 years through age 75, unless early forms of pre-cancerous polyps or small growths are found.  Hepatitis C blood testing is recommended for all people born from 1945 through 1965 and any individual with known risks for hepatitis C.  Healthy  men should no longer receive prostate-specific antigen (PSA) blood tests as part of routine cancer screening. Consult with your caregiver about prostate cancer screening.  Testicular cancer screening is not recommended for adolescents or adult males who have no symptoms. Screening includes self-exam, caregiver exam, and other screening tests. Consult with your caregiver about any symptoms you have or any concerns you have about testicular cancer.  Practice safe sex. Use condoms and avoid high-risk sexual practices to reduce the spread of sexually transmitted infections (STIs).  Use sunscreen with a sun protection factor (SPF) of 30 or greater. Apply sunscreen liberally and repeatedly throughout the day. You should seek shade when your shadow is shorter than you. Protect yourself by wearing long sleeves, pants, a wide-brimmed hat, and sunglasses year round, whenever you are outdoors.  Notify your caregiver of new moles or changes in moles, especially if there is a change in shape or color. Also notify your caregiver if a mole is larger than the size of a pencil eraser.  A one-time screening for abdominal aortic aneurysm (AAA) and surgical repair of large AAAs by sound wave imaging (ultrasonography) is recommended for ages 65 to 75 years who are current or former smokers.  Stay current with your immunizations. Document Released: 11/13/2007 Document Revised: 08/09/2011 Document Reviewed: 10/12/2010 [employer] Patient Information 2014 [employer].  

## 2013-01-16 ENCOUNTER — Encounter: Payer: Self-pay | Admitting: Internal Medicine

## 2013-01-17 ENCOUNTER — Ambulatory Visit: Payer: 59 | Admitting: Gastroenterology

## 2013-02-06 ENCOUNTER — Telehealth: Payer: Self-pay

## 2013-02-06 NOTE — Telephone Encounter (Signed)
Patient called [name] [name] stating that he has ran out of the Fioricet #15/0 that was given at ED on 12/21/12 for his migraines. Patient is completely out of medication and request refills. Please advise, Thanks

## 2013-02-06 NOTE — Telephone Encounter (Signed)
I do not use this medication.

## 2013-02-06 NOTE — Telephone Encounter (Signed)
Patient has not seen Dr. Vela Prose in a while and will also need a referral back to that office.

## 2013-02-08 MED ORDER — NORTRIPTYLINE HCL 10 MG PO CAPS: 10.0000 mg | ORAL_CAPSULE | Freq: Every day | ORAL | Status: DC

## 2013-02-08 NOTE — Telephone Encounter (Signed)
Notified pt. He has not been contacted re: neuro referral that was ordered in August and wants to know if there is an alternative you could recommend for his headache until he can see neurology?  Please advise.

## 2013-02-08 NOTE — Telephone Encounter (Signed)
Notified pt and he voices understanding. States neurology called and he has an appt tomorrow.

## 2013-02-08 NOTE — Telephone Encounter (Signed)
Start pamelor at bedtime

## 2013-02-09 ENCOUNTER — Ambulatory Visit (INDEPENDENT_AMBULATORY_CARE_PROVIDER_SITE_OTHER): Payer: 59 | Admitting: Diagnostic Neuroimaging

## 2013-02-09 ENCOUNTER — Encounter: Payer: Self-pay | Admitting: Diagnostic Neuroimaging

## 2013-02-09 VITALS — BP 136/91 | HR 64 | Temp 98.1°F | Ht 70.0 in | Wt 263.0 lb

## 2013-02-09 DIAGNOSIS — R51 Headache: Secondary | ICD-10-CM

## 2013-02-09 DIAGNOSIS — F0781 Postconcussional syndrome: Secondary | ICD-10-CM

## 2013-02-09 DIAGNOSIS — G43009 Migraine without aura, not intractable, without status migrainosus: Secondary | ICD-10-CM

## 2013-02-09 MED ORDER — SUMATRIPTAN SUCCINATE 100 MG PO TABS: 100.0000 mg | ORAL_TABLET | Freq: Once | ORAL | Status: DC | PRN

## 2013-02-09 NOTE — Patient Instructions (Addendum)
Migraine Headache A migraine headache is an intense, throbbing pain on one or both sides of your head. A migraine can last for 30 minutes to several hours. CAUSES  The exact cause of a migraine headache is not always known. However, a migraine may be caused when nerves in the brain become irritated and release chemicals that cause inflammation. This causes pain. SYMPTOMS  Pain on one or both sides of your head.  Pulsating or throbbing pain.  Severe pain that prevents daily activities.  Pain that is aggravated by any physical activity.  Nausea, vomiting, or both.  Dizziness.  Pain with exposure to bright lights, loud noises, or activity.  General sensitivity to bright lights, loud noises, or smells. Before you get a migraine, you may get warning signs that a migraine is coming (aura). An aura may include:  Seeing flashing lights.  Seeing bright spots, halos, or zig-zag lines.  Having tunnel vision or blurred vision.  Having feelings of numbness or tingling.  Having trouble talking.  Having muscle weakness. MIGRAINE TRIGGERS  Alcohol.  Smoking.  Stress.  Menstruation.  Aged cheeses.  Foods or drinks that contain nitrates, glutamate, aspartame, or tyramine.  Lack of sleep.  Chocolate.  Caffeine.  Hunger.  Physical exertion.  Fatigue.  Medicines used to treat chest pain (nitroglycerine), birth control pills, estrogen, and some blood pressure medicines. DIAGNOSIS  A migraine headache is often diagnosed based on:  Symptoms.  Physical examination.  A CT scan or MRI of your head. TREATMENT Medicines may be given for pain and nausea. Medicines can also be given to help prevent recurrent migraines.  HOME CARE INSTRUCTIONS  Only take over-the-counter or prescription medicines for pain or discomfort as directed by your caregiver. The use of long-term narcotics is not recommended.  Lie down in a dark, quiet room when you have a migraine.  Keep a journal  to find out what may trigger your migraine headaches. For example, write down:  What you eat and drink.  How much sleep you get.  Any change to your diet or medicines.  Limit alcohol consumption.  Quit smoking if you smoke.  Get 7 to 9 hours of sleep, or as recommended by your caregiver.  Limit stress.  Keep lights dim if bright lights bother you and make your migraines worse. SEEK IMMEDIATE MEDICAL CARE IF:   Your migraine becomes severe.  You have a fever.  You have a stiff neck.  You have vision loss.  You have muscular weakness or loss of muscle control.  You start losing your balance or have trouble walking.  You feel faint or pass out.  You have severe symptoms that are different from your first symptoms. MAKE SURE YOU:   Understand these instructions.  Will watch your condition.  Will get help right away if you are not doing well or get worse. Document Released: 05/17/2005 Document Revised: 08/09/2011 Document Reviewed: 05/07/2011 ExitCare Patient Information 2014 [employer].  
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
## 2013-02-13 ENCOUNTER — Encounter: Payer: Self-pay | Admitting: Gastroenterology

## 2013-02-13 ENCOUNTER — Ambulatory Visit (INDEPENDENT_AMBULATORY_CARE_PROVIDER_SITE_OTHER): Payer: 59 | Admitting: Gastroenterology

## 2013-02-13 VITALS — BP 120/88 | HR 68 | Ht 70.0 in | Wt 264.4 lb

## 2013-02-13 DIAGNOSIS — K219 Gastro-esophageal reflux disease without esophagitis: Secondary | ICD-10-CM

## 2013-02-13 NOTE — Patient Instructions (Addendum)
You have been scheduled for an endoscopy with propofol. Please follow written instructions given to you at your visit today. If you use inhalers (even only as needed), please bring them with you on the day of your procedure. Your physician has requested that you go to www.startemmi.com and enter the access code given to you at your visit today. This web site gives a general overview about your procedure. However, you should still follow specific instructions given to you by our office regarding your preparation for the procedure.  Patient advised to avoid spicy, acidic, citrus, chocolate, mints, fruit and fruit juices.  Limit the intake of caffeine, alcohol and Soda.  Don't exercise too soon after eating.  Don't lie down within 3-4 hours of eating.  Elevate the head of your bed.  Thank you for choosing me and [facility].  Malcolm T. Stark, Jr., MD., FACG  

## 2013-02-13 NOTE — Progress Notes (Signed)
History of Present Illness: This is a 42 year old male with frequent reflux symptoms for several years. He has occasional episodes of nighttime regurgitation and choking. He has had about 6 nighttime episodes over the past year that he recalls. He was recently started on Dexilant and reports substantial symptom improvement. Prior to that he had not used any over-the-counter reflux medications. Denies weight loss, abdominal pain, constipation, diarrhea, change in stool caliber, melena, hematochezia, nausea, vomiting, dysphagia, chest pain.  Review of Systems: Pertinent positive and negative review of systems were noted in the above HPI section. All other review of systems were otherwise negative.  Current Medications, Allergies, Past Medical History, Past Surgical History, Family History and Social History were reviewed in [employer] record.  Physical Exam: General: Well developed , well nourished, no acute distress Head: Normocephalic and atraumatic Eyes:  sclerae anicteric, EOMI Ears: Normal auditory acuity Mouth: No deformity or lesions Neck: Supple, no masses or thyromegaly Lungs: Clear throughout to auscultation Heart: Regular rate and rhythm; no murmurs, rubs or bruits Abdomen: Soft, non tender and non distended. No masses, hepatosplenomegaly or hernias noted. Normal Bowel sounds Musculoskeletal: Symmetrical with no gross deformities  Skin: No lesions on visible extremities Pulses:  Normal pulses noted Extremities: No clubbing, cyanosis, edema or deformities noted Neurological: Alert oriented x 4, grossly nonfocal Cervical Nodes:  No significant cervical adenopathy Inguinal Nodes: No significant inguinal adenopathy Psychological:  Alert and cooperative. Normal mood and affect  Assessment and Recommendations:  1. GERD. Significant nocturnal symptoms. Continue  Dexilant. Follow standard antireflux measures. Schedule upper endoscopy to rule out esophagitis and  Barrett's.  The risks, benefits, and alternatives to endoscopy with possible biopsy and possible dilation were discussed with the patient and they consent to proceed.   2. Colorectal cancer screening, average risk. Screening colonoscopy at age 61.

## 2013-03-20 ENCOUNTER — Telehealth: Payer: Self-pay | Admitting: *Deleted

## 2013-03-20 NOTE — Telephone Encounter (Signed)
Yes charge 

## 2013-03-22 ENCOUNTER — Encounter: Payer: 59 | Admitting: Gastroenterology

## 2013-04-08 ENCOUNTER — Other Ambulatory Visit: Payer: Self-pay | Admitting: Internal Medicine

## 2013-04-09 ENCOUNTER — Emergency Department (HOSPITAL_COMMUNITY): Payer: 59

## 2013-04-09 ENCOUNTER — Encounter (HOSPITAL_COMMUNITY): Payer: Self-pay | Admitting: Emergency Medicine

## 2013-04-09 ENCOUNTER — Emergency Department (HOSPITAL_COMMUNITY)
Admission: EM | Admit: 2013-04-09 | Discharge: 2013-04-09 | Disposition: A | Discharge status: Home / Self Care | Payer: 59 | Attending: Emergency Medicine | Admitting: Emergency Medicine

## 2013-04-09 DIAGNOSIS — J029 Acute pharyngitis, unspecified: Secondary | ICD-10-CM | POA: Insufficient documentation

## 2013-04-09 DIAGNOSIS — Z8719 Personal history of other diseases of the digestive system: Secondary | ICD-10-CM | POA: Insufficient documentation

## 2013-04-09 DIAGNOSIS — R404 Transient alteration of awareness: Secondary | ICD-10-CM | POA: Insufficient documentation

## 2013-04-09 DIAGNOSIS — R55 Syncope and collapse: Secondary | ICD-10-CM | POA: Insufficient documentation

## 2013-04-09 DIAGNOSIS — G43909 Migraine, unspecified, not intractable, without status migrainosus: Secondary | ICD-10-CM | POA: Insufficient documentation

## 2013-04-09 DIAGNOSIS — R42 Dizziness and giddiness: Secondary | ICD-10-CM | POA: Insufficient documentation

## 2013-04-09 DIAGNOSIS — Z87891 Personal history of nicotine dependence: Secondary | ICD-10-CM | POA: Insufficient documentation

## 2013-04-09 DIAGNOSIS — I10 Essential (primary) hypertension: Secondary | ICD-10-CM | POA: Insufficient documentation

## 2013-04-09 DIAGNOSIS — Z79899 Other long term (current) drug therapy: Secondary | ICD-10-CM | POA: Insufficient documentation

## 2013-04-09 DIAGNOSIS — Z87442 Personal history of urinary calculi: Secondary | ICD-10-CM | POA: Insufficient documentation

## 2013-04-09 LAB — CBC
HCT: 41.7 % (ref 39.0–52.0)
Hemoglobin: 15.4 g/dL (ref 13.0–17.0)
MCH: 29.3 pg (ref 26.0–34.0)
MCHC: 36.9 g/dL — ABNORMAL HIGH (ref 30.0–36.0)
MCV: 79.3 fL (ref 78.0–100.0)
RBC: 5.26 MIL/uL (ref 4.22–5.81)
RDW: 13.6 % (ref 11.5–15.5)

## 2013-04-09 LAB — BASIC METABOLIC PANEL
BUN: 9 mg/dL (ref 6–23)
CO2: 27 mEq/L (ref 19–32)
Calcium: 9.5 mg/dL (ref 8.4–10.5)
Creatinine, Ser: 0.94 mg/dL (ref 0.50–1.35)
GFR calc non Af Amer: >90 mL/min (ref >90)
Glucose, Bld: 110 mg/dL — ABNORMAL HIGH (ref 70–99)

## 2013-04-09 LAB — POCT I-STAT TROPONIN I: Troponin i, poc: 0 ng/mL (ref 0.00–0.08)

## 2013-04-09 MED ORDER — DIPHENHYDRAMINE HCL 25 MG PO CAPS
50.0000 mg | ORAL_CAPSULE | Freq: Once | ORAL | Status: AC
  Administered 2013-04-09: 50 mg via ORAL
  Filled 2013-04-09: qty 2

## 2013-04-09 MED ORDER — KETOROLAC TROMETHAMINE 60 MG/2ML IM SOLN
60.0000 mg | Freq: Once | INTRAMUSCULAR | Status: AC
  Administered 2013-04-09: 60 mg via INTRAMUSCULAR
  Filled 2013-04-09: qty 2

## 2013-04-09 MED ORDER — METOCLOPRAMIDE HCL 5 MG/ML IJ SOLN
10.0000 mg | Freq: Once | INTRAMUSCULAR | Status: AC
  Administered 2013-04-09: 10 mg via INTRAMUSCULAR
  Filled 2013-04-09: qty 2

## 2013-04-09 NOTE — ED Notes (Signed)
Pt states a little after 5am this morning, woke up, really bad sore throat, redness in throat. Pt started getting dressed after 7 this morning, fell on the floor, LOC, woke up at 11am in the hallway. Woke up on his side. Denies pain anywhere. Is seeing a neurologist, was given medication for headaches, has follow up appt in December. Has history of LOC once this past year. Pt states he has hx of vertigo, blurry vision, dizziness, states he feels those three symptoms now. Denies CP, SOB. Headache 7/10 pain.

## 2013-04-09 NOTE — ED Notes (Signed)
Pt c/o HA that are migraines with hx of same and sore throat this am; pt sts syncopal episode where pt woke up on floor today

## 2013-04-09 NOTE — ED Provider Notes (Signed)
CSN: [id number]     Arrival date & time 04/09/13  1252 History   First MD Initiated Contact with Patient 04/09/13 1740     Chief Complaint  Patient presents with  . Headache  . Sore Throat  . Loss of Consciousness   (Consider location/radiation/quality/duration/timing/severity/associated sxs/prior Treatment) HPI Comments: 42 yo male with htn, gerd, migraine hx presents with ha, sore throat and syncope.  Pt woke up this am with sore throat, similar to previous and had gradual onset HA similar to previous migraines which he has had more frequently the past few months.  Pt was gargling peroxide and then shortly after he had brief lightheaded and passed out.  Pt has mild general HA.  He feels improved since.  No known cardiac hx.  No cp or sob.    Patient is a 42 y.o. male presenting with headaches, pharyngitis, and syncope. The history is provided by the patient.  Headache Associated symptoms: nausea and syncope   Associated symptoms: no abdominal pain, no back pain, no congestion, no fever, no neck pain, no neck stiffness and no vomiting   Sore Throat Associated symptoms include headaches. Pertinent negatives include no chest pain, no abdominal pain and no shortness of breath.  Loss of Consciousness Associated symptoms: headaches and nausea   Associated symptoms: no chest pain, no fever, no shortness of breath and no vomiting     Past Medical History  Diagnosis Date  . Gallstone   . Chronic headaches   . Nephrolithiasis   . Hypertension   . Pancreatitis     gallstone related   Past Surgical History  Procedure Laterality Date  . Appendectomy    . Cholecystectomy    . Laparotomy     Family History  Problem Relation Age of Onset  . Arthritis    . Diabetes    . Cancer Neg Hx   . Depression Neg Hx   . Early death Neg Hx   . Heart disease Neg Hx   . Hypertension Neg Hx   . Kidney disease Neg Hx   . Hyperlipidemia Neg Hx   . Stroke Neg Hx    History  Substance Use Topics   . Smoking status: Former Smoker    Quit date: 05/31/1994  . Smokeless tobacco: Never Used  . Alcohol Use: No    Review of Systems  Constitutional: Negative for fever and chills.  HENT: Negative for congestion.   Eyes: Positive for visual disturbance (blurry).  Respiratory: Negative for shortness of breath.   Cardiovascular: Positive for syncope. Negative for chest pain.  Gastrointestinal: Positive for nausea. Negative for vomiting and abdominal pain.  Genitourinary: Negative for dysuria and flank pain.  Musculoskeletal: Negative for back pain, neck pain and neck stiffness.  Skin: Negative for rash.  Neurological: Positive for syncope, light-headedness and headaches.    Allergies  Review of patient's allergies indicates no known allergies.  Home Medications   Current Outpatient Rx  Name  Route  Sig  Dispense  Refill  . Ascorbic Acid (VITAMIN C) 1000 MG tablet   Oral   Take 1,000 mg by mouth daily.         Marland Kitchen b complex vitamins capsule   Oral   Take 1 capsule by mouth daily.         . Biotin 1000 MCG tablet   Oral   Take 1,000 mcg by mouth 3 (three) times daily.         . cholecalciferol (VITAMIN D) 1000 UNITS  tablet   Oral   Take 1,000 Units by mouth daily.         Marland Kitchen dexlansoprazole (DEXILANT) 60 MG capsule   Oral   Take 1 capsule (60 mg total) by mouth daily.   90 capsule   3   . fish oil-omega-3 fatty acids 1000 MG capsule   Oral   Take 1 g by mouth daily.         Marland Kitchen ibuprofen (ADVIL,MOTRIN) 800 MG tablet   Oral   Take 800 mg by mouth every 8 (eight) hours as needed for mild pain or moderate pain.         . nebivolol (BYSTOLIC) 5 MG tablet   Oral   Take 1 tablet (5 mg total) by mouth daily.   90 tablet   3   . nortriptyline (PAMELOR) 10 MG capsule   Oral   Take 1 capsule (10 mg total) by mouth at bedtime.   90 capsule   1   . potassium chloride SA (K-DUR,KLOR-CON) 20 MEQ tablet   Oral   Take 1 tablet (20 mEq total) by mouth 2 (two)  times daily.   60 tablet   3   . SUMAtriptan (IMITREX) 100 MG tablet   Oral   Take 1 tablet (100 mg total) by mouth once as needed for migraine. May repeat x 1 after 2 hours; maximum 2 tabs per day and 8 tabs per month   8 tablet   6   . traMADol (ULTRAM) 50 MG tablet   Oral   Take 1 tablet (50 mg total) by mouth every 8 (eight) hours as needed for pain.   90 tablet   3    BP 184/120  Pulse 69  Temp(Src) 98.8 F (37.1 C) (Oral)  Resp 18  Ht 5\' 10"  (1.778 m)  Wt 271 lb 3.2 oz (123.016 kg)  BMI 38.91 kg/m2  SpO2 99% Physical Exam  Nursing note and vitals reviewed. Constitutional: He is oriented to person, place, and time. He appears well-developed and well-nourished.  HENT:  Head: Normocephalic and atraumatic.  Dry mm No trismus, uvular deviation, unilateral posterior pharyngeal edema or submandibular swelling.   Eyes: Conjunctivae are normal. Right eye exhibits no discharge. Left eye exhibits no discharge.  Neck: Normal range of motion. Neck supple. No tracheal deviation present.  Cardiovascular: Normal rate and regular rhythm.   Pulmonary/Chest: Effort normal and breath sounds normal.  Abdominal: Soft. He exhibits no distension. There is no tenderness. There is no guarding.  Musculoskeletal: He exhibits no edema and no tenderness.  Neurological: He is alert and oriented to person, place, and time. GCS eye subscore is 4. GCS verbal subscore is 5. GCS motor subscore is 6.  5+ strength in UE and LE with f/e at major joints. Sensation to palpation intact in UE and LE. CNs 2-12 grossly intact.  EOMFI.  PERRL.   Finger nose and coordination intact bilateral.   Visual fields intact to finger testing.   Skin: Skin is warm. No rash noted.  Psychiatric: He has a normal mood and affect.    ED Course  Procedures (including critical care time) Labs Review Labs Reviewed  CBC - Abnormal; Notable for the following:    MCHC 36.9 (*)    All other components within normal limits   BASIC METABOLIC PANEL - Abnormal; Notable for the following:    Potassium 3.4 (*)    Glucose, Bld 110 (*)    All other components within normal limits  RAPID STREP SCREEN  CULTURE, GROUP A STREP  POCT I-STAT TROPONIN I   Imaging Review Dg Chest 2 View  04/09/2013   CLINICAL DATA:  Headache with sore throat.  EXAM: CHEST  2 VIEW  COMPARISON:  08/03/2012  FINDINGS: The lungs are clear without focal infiltrate, edema, pneumothorax or pleural effusion. Cardiopericardial silhouette is at upper limits of normal for size. Imaged bony structures of the thorax are intact.  IMPRESSION: Stable. No acute cardiopulmonary findings.   Electronically Signed   By: Kennith Center M.D.   On: 04/09/2013 14:14   Ct Head Wo Contrast  04/09/2013   CLINICAL DATA:  Loss of consciousness.  Headache.  EXAM: CT HEAD WITHOUT CONTRAST  TECHNIQUE: Contiguous axial images were obtained from the base of the skull through the vertex without intravenous contrast.  COMPARISON:  12/21/2012  FINDINGS: The brain has a normal appearance without evidence of atrophy, infarction, mass lesion, hemorrhage, hydrocephalus or extra-axial collection. The calvarium is unremarkable. The paranasal sinuses, middle ears and mastoids are clear.  IMPRESSION: Normal head CT   Electronically Signed   By: Paulina Fusi M.D.   On: 04/09/2013 19:31    EKG Interpretation     Ventricular Rate:  61 PR Interval:  156 QRS Duration: 102 QT Interval:  366 QTC Calculation: 368 R Axis:   27 Text Interpretation:  Normal sinus rhythm Cannot rule out Inferior infarct , age undetermined Abnormal ECG Similar to previous            MDM   1. Migraine   2. Syncope    Normal neuro exam. CT done as ha with syncope to look for signs of bleeding low suspicion due to recurrent ha hx similar. Likely syncope from vasovagal/ dehydration from sore throat.  No acute ekg findings.  Discussed close fup outpt for syncope and htn. Pain meds given in ED.  Pt  moved to pod C pending CT head.   Discussed close fup for HTN and risks of not controlling bp including stroke/ MI. HA improved in ED, normal neuro exam.  Close fup outpt discussed.    Results and differential diagnosis were discussed with the patient. Close follow up outpatient was discussed, patient comfortable with the plan.   Diagnosis: HTN, Migraine, Syncope   Enid Skeens, MD 04/10/13 [phone]

## 2013-04-11 LAB — CULTURE, GROUP A STREP

## 2013-04-12 ENCOUNTER — Ambulatory Visit: Payer: 59 | Admitting: Internal Medicine

## 2013-04-12 DIAGNOSIS — Z0289 Encounter for other administrative examinations: Secondary | ICD-10-CM

## 2013-04-23 ENCOUNTER — Ambulatory Visit (INDEPENDENT_AMBULATORY_CARE_PROVIDER_SITE_OTHER): Payer: 59 | Admitting: Internal Medicine

## 2013-04-23 ENCOUNTER — Encounter: Payer: Self-pay | Admitting: Internal Medicine

## 2013-04-23 ENCOUNTER — Other Ambulatory Visit (INDEPENDENT_AMBULATORY_CARE_PROVIDER_SITE_OTHER): Payer: 59

## 2013-04-23 VITALS — BP 122/88 | HR 68 | Temp 97.1°F | Resp 16 | Ht 70.0 in | Wt 272.0 lb

## 2013-04-23 DIAGNOSIS — I10 Essential (primary) hypertension: Secondary | ICD-10-CM

## 2013-04-23 DIAGNOSIS — Z23 Encounter for immunization: Secondary | ICD-10-CM

## 2013-04-23 DIAGNOSIS — G43009 Migraine without aura, not intractable, without status migrainosus: Secondary | ICD-10-CM

## 2013-04-23 DIAGNOSIS — E876 Hypokalemia: Secondary | ICD-10-CM

## 2013-04-23 DIAGNOSIS — E669 Obesity, unspecified: Secondary | ICD-10-CM

## 2013-04-23 DIAGNOSIS — R7309 Other abnormal glucose: Secondary | ICD-10-CM

## 2013-04-23 LAB — URINALYSIS, ROUTINE W REFLEX MICROSCOPIC
Bilirubin Urine: NEGATIVE
Hgb urine dipstick: NEGATIVE
Ketones, ur: NEGATIVE
Leukocytes, UA: NEGATIVE
Specific Gravity, Urine: 1.01 (ref 1.000–1.030)
Urine Glucose: NEGATIVE
Urobilinogen, UA: 0.2 (ref 0.0–1.0)

## 2013-04-23 LAB — BASIC METABOLIC PANEL
BUN: 9 mg/dL (ref 6–23)
Calcium: 9 mg/dL (ref 8.4–10.5)
Creatinine, Ser: 1 mg/dL (ref 0.4–1.5)
GFR: 104.63 mL/min (ref >60.00)
Glucose, Bld: 93 mg/dL (ref 70–99)
Potassium: 3.5 mEq/L (ref 3.5–5.1)
Sodium: 137 mEq/L (ref 135–145)

## 2013-04-23 LAB — HEMOGLOBIN A1C: Hgb A1c MFr Bld: 5.7 % (ref 4.6–6.5)

## 2013-04-23 MED ORDER — SUMATRIPTAN SUCCINATE 100 MG PO TABS: 100.0000 mg | ORAL_TABLET | Freq: Once | ORAL | Status: DC | PRN

## 2013-04-23 NOTE — Assessment & Plan Note (Signed)
His BP is well controlled 

## 2013-04-23 NOTE — Progress Notes (Signed)
Pre visit review using our clinic review tool, if applicable. No additional management support is needed unless otherwise documented below in the visit note. 

## 2013-04-23 NOTE — Patient Instructions (Signed)

## 2013-04-23 NOTE — Progress Notes (Signed)
  Subjective:    Patient ID: [name] [name], male    DOB: 17-Dec-1970, 42 y.o.   MRN: [id number]  Hypertension This is a chronic problem. The current episode started more than 1 year ago. The problem has been gradually improving since onset. The problem is controlled. Pertinent negatives include no anxiety, blurred vision, chest pain, headaches, malaise/fatigue, neck pain, orthopnea, palpitations, peripheral edema, PND, shortness of breath or sweats. Past treatments include beta blockers. The current treatment provides moderate improvement. Compliance problems include diet and exercise.       Review of Systems  Constitutional: Negative for fever, chills, malaise/fatigue, diaphoresis, activity change, appetite change, fatigue and unexpected weight change.  HENT: Negative.   Eyes: Negative.  Negative for blurred vision.  Respiratory: Negative.  Negative for apnea, cough, choking, chest tightness, shortness of breath, wheezing and stridor.   Cardiovascular: Negative.  Negative for chest pain, palpitations, orthopnea, leg swelling and PND.  Gastrointestinal: Negative.  Negative for nausea, vomiting, abdominal pain, diarrhea, constipation and blood in stool.  Endocrine: Negative.  Negative for polydipsia, polyphagia and polyuria.  Genitourinary: Negative.   Musculoskeletal: Negative.  Negative for arthralgias, neck pain and neck stiffness.  Skin: Negative.   Allergic/Immunologic: Negative.   Neurological: Negative.  Negative for dizziness, tremors, syncope, weakness, light-headedness, numbness and headaches.  Hematological: Negative.  Negative for adenopathy. Does not bruise/bleed easily.  Psychiatric/Behavioral: Negative.        Objective:   Physical Exam  Vitals reviewed. Constitutional: He is oriented to person, place, and time. He appears well-developed and well-nourished. No distress.  HENT:  Head: Normocephalic and atraumatic.  Mouth/Throat: Oropharynx is clear and moist. No  oropharyngeal exudate.  Eyes: Conjunctivae are normal. Right eye exhibits no discharge. Left eye exhibits no discharge. No scleral icterus.  Neck: Normal range of motion. Neck supple. No JVD present. No tracheal deviation present. No thyromegaly present.  Cardiovascular: Normal rate, regular rhythm, normal heart sounds and intact distal pulses.  Exam reveals no gallop and no friction rub.   No murmur heard. Pulmonary/Chest: Effort normal and breath sounds normal. No stridor. No respiratory distress. He has no wheezes. He has no rales. He exhibits no tenderness.  Abdominal: Soft. Bowel sounds are normal. He exhibits no distension and no mass. There is no tenderness. There is no rebound and no guarding.  Musculoskeletal: Normal range of motion. He exhibits no edema and no tenderness.  Lymphadenopathy:    He has no cervical adenopathy.  Neurological: He is oriented to person, place, and time.  Skin: Skin is warm and dry. No rash noted. He is not diaphoretic. No erythema. No pallor.  Psychiatric: He has a normal mood and affect. His behavior is normal. Judgment and thought content normal.     Lab Results  Component Value Date   WBC 8.5 04/09/2013   HGB 15.4 04/09/2013   HCT 41.7 04/09/2013   PLT 346 04/09/2013   GLUCOSE 110* 04/09/2013   CHOL 170 01/15/2013   TRIG 382.0* 01/15/2013   HDL 27.40* 01/15/2013   LDLDIRECT 98.3 01/15/2013   LDLCALC 116* 08/13/2011   ALT 22 12/21/2012   AST 21 12/21/2012   NA 140 04/09/2013   K 3.4* 04/09/2013   CL 103 04/09/2013   CREATININE 0.94 04/09/2013   BUN 9 04/09/2013   CO2 27 04/09/2013   TSH 3.22 01/15/2013   PSA 0.73 01/15/2013   INR 0.91 12/21/2012   HGBA1C 5.8 06/16/2012       Assessment & Plan:

## 2013-04-23 NOTE — Assessment & Plan Note (Signed)
I will check his A1C to see if he has developed DM2 

## 2013-04-23 NOTE — Assessment & Plan Note (Signed)
He has had no HA for two weeks now, he will continue nortriptyiline daily and imitrex as needed

## 2013-04-23 NOTE — Assessment & Plan Note (Signed)
He will remain on K+ replacement therapy I will check his K+ and Mg++ levels and urine spot K+ level to try to find where the potassium is going Will also screen him for hypercortisolism today

## 2013-04-23 NOTE — Assessment & Plan Note (Signed)
He is working on his lifestyle modifications 

## 2013-04-24 ENCOUNTER — Encounter: Payer: Self-pay | Admitting: Internal Medicine

## 2013-04-24 LAB — POTASSIUM, URINE, RANDOM: Potassium Urine: 71 mEq/L

## 2013-05-11 ENCOUNTER — Ambulatory Visit (INDEPENDENT_AMBULATORY_CARE_PROVIDER_SITE_OTHER): Payer: 59 | Admitting: Nurse Practitioner

## 2013-05-11 ENCOUNTER — Encounter: Payer: Self-pay | Admitting: Nurse Practitioner

## 2013-05-11 ENCOUNTER — Encounter (INDEPENDENT_AMBULATORY_CARE_PROVIDER_SITE_OTHER): Payer: Self-pay

## 2013-05-11 VITALS — BP 152/94 | HR 74 | Temp 98.2°F | Ht 71.0 in | Wt 275.0 lb

## 2013-05-11 DIAGNOSIS — R519 Headache, unspecified: Secondary | ICD-10-CM

## 2013-05-11 DIAGNOSIS — F0781 Postconcussional syndrome: Secondary | ICD-10-CM

## 2013-05-11 DIAGNOSIS — G43009 Migraine without aura, not intractable, without status migrainosus: Secondary | ICD-10-CM

## 2013-05-11 DIAGNOSIS — R51 Headache: Secondary | ICD-10-CM

## 2013-05-11 DIAGNOSIS — R55 Syncope and collapse: Secondary | ICD-10-CM

## 2013-05-11 MED ORDER — TOPIRAMATE 25 MG PO TABS: 25.0000 mg | ORAL_TABLET | Freq: Two times a day (BID) | ORAL | Status: DC

## 2013-05-11 MED ORDER — ALSUMA 6 MG/0.5ML ~~LOC~~ SOAJ: 6.0000 mg | Freq: Once | SUBCUTANEOUS | Status: DC
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
## 2013-05-11 NOTE — Patient Instructions (Signed)
Referral to Cardiology, Dr. Verdis Prime for Syncope.  Start Topamax 25 mg daily, after 1 week increase to 2 tablets.  Try samples of Sumatriptan Injections, if it works, you may get Rx filled.  Continue Sumatriptan tablets as needed.  Follow up in 6 months, sooner as needed.

## 2013-05-18 ENCOUNTER — Ambulatory Visit: Payer: 59 | Admitting: Internal Medicine

## 2013-06-03 ENCOUNTER — Other Ambulatory Visit: Payer: Self-pay | Admitting: Internal Medicine

## 2013-06-29 ENCOUNTER — Encounter: Payer: Self-pay | Admitting: Internal Medicine

## 2013-06-29 ENCOUNTER — Ambulatory Visit (INDEPENDENT_AMBULATORY_CARE_PROVIDER_SITE_OTHER): Payer: 59 | Admitting: Internal Medicine

## 2013-06-29 VITALS — BP 120/80 | HR 67 | Temp 97.6°F | Resp 16 | Ht 70.0 in | Wt 277.0 lb

## 2013-06-29 DIAGNOSIS — I10 Essential (primary) hypertension: Secondary | ICD-10-CM

## 2013-06-29 NOTE — Progress Notes (Signed)
   Subjective:    Patient ID: [name] [name], male    DOB: 1970-08-25, 43 y.o.   MRN: [id number]  Hypertension This is a chronic problem. The current episode started more than 1 year ago. The problem has been gradually improving since onset. The problem is controlled. Pertinent negatives include no anxiety, blurred vision, chest pain, headaches, malaise/fatigue, neck pain, orthopnea, palpitations, peripheral edema, PND, shortness of breath or sweats. Past treatments include beta blockers. The current treatment provides significant improvement. Compliance problems include exercise and diet.       Review of Systems  Constitutional: Negative.  Negative for malaise/fatigue.  HENT: Negative.   Eyes: Negative.  Negative for blurred vision.  Respiratory: Negative.  Negative for shortness of breath, wheezing and stridor.   Cardiovascular: Negative.  Negative for chest pain, palpitations, orthopnea, leg swelling and PND.  Gastrointestinal: Negative.  Negative for abdominal pain.  Endocrine: Negative.   Genitourinary: Negative.   Musculoskeletal: Negative.  Negative for neck pain.  Skin: Negative.   Allergic/Immunologic: Negative.   Neurological: Negative.  Negative for dizziness, tremors, weakness, light-headedness and headaches.  Hematological: Negative.  Negative for adenopathy. Does not bruise/bleed easily.  Psychiatric/Behavioral: Negative.        Objective:   Physical Exam  Vitals reviewed. Constitutional: He is oriented to person, place, and time. He appears well-developed and well-nourished. No distress.  HENT:  Head: Normocephalic and atraumatic.  Mouth/Throat: Oropharynx is clear and moist. No oropharyngeal exudate.  Eyes: Conjunctivae are normal. Right eye exhibits no discharge. Left eye exhibits no discharge. No scleral icterus.  Neck: Normal range of motion. Neck supple. No JVD present. No tracheal deviation present. No thyromegaly present.  Cardiovascular: Normal rate,  regular rhythm, normal heart sounds and intact distal pulses.  Exam reveals no gallop and no friction rub.   No murmur heard. Pulmonary/Chest: Effort normal and breath sounds normal. No stridor. No respiratory distress. He has no wheezes. He has no rales. He exhibits no tenderness.  Abdominal: Soft. Bowel sounds are normal. He exhibits no distension and no mass. There is no tenderness. There is no rebound and no guarding.  Musculoskeletal: Normal range of motion. He exhibits no edema and no tenderness.  Lymphadenopathy:    He has no cervical adenopathy.  Neurological: He is oriented to person, place, and time.  Skin: Skin is warm and dry. No rash noted. He is not diaphoretic. No erythema. No pallor.  Psychiatric: He has a normal mood and affect. His behavior is normal. Judgment and thought content normal.     Lab Results  Component Value Date   WBC 8.5 04/09/2013   HGB 15.4 04/09/2013   HCT 41.7 04/09/2013   PLT 346 04/09/2013   GLUCOSE 93 04/23/2013   CHOL 170 01/15/2013   TRIG 382.0* 01/15/2013   HDL 27.40* 01/15/2013   LDLDIRECT 98.3 01/15/2013   LDLCALC 116* 08/13/2011   ALT 22 12/21/2012   AST 21 12/21/2012   NA 137 04/23/2013   K 3.5 04/23/2013   CL 102 04/23/2013   CREATININE 1.0 04/23/2013   BUN 9 04/23/2013   CO2 29 04/23/2013   TSH 3.22 01/15/2013   PSA 0.73 01/15/2013   INR 0.91 12/21/2012   HGBA1C 5.7 04/23/2013       Assessment & Plan:

## 2013-06-29 NOTE — Patient Instructions (Signed)

## 2013-06-29 NOTE — Progress Notes (Signed)
Pre visit review using our clinic review tool, if applicable. No additional management support is needed unless otherwise documented below in the visit note. 

## 2013-06-29 NOTE — Assessment & Plan Note (Signed)
His BP is well controlled 

## 2013-07-12 ENCOUNTER — Institutional Professional Consult (permissible substitution): Payer: 59 | Admitting: Interventional Cardiology

## 2013-08-08 ENCOUNTER — Ambulatory Visit: Payer: 59 | Admitting: Internal Medicine

## 2013-08-13 ENCOUNTER — Encounter: Payer: Self-pay | Admitting: Internal Medicine

## 2013-08-13 ENCOUNTER — Ambulatory Visit (INDEPENDENT_AMBULATORY_CARE_PROVIDER_SITE_OTHER): Payer: 59 | Admitting: Internal Medicine

## 2013-08-13 VITALS — BP 142/100 | HR 79 | Temp 98.3°F | Resp 16 | Ht 70.0 in | Wt 279.8 lb

## 2013-08-13 DIAGNOSIS — I10 Essential (primary) hypertension: Secondary | ICD-10-CM

## 2013-08-13 DIAGNOSIS — IMO0002 Reserved for concepts with insufficient information to code with codable children: Secondary | ICD-10-CM

## 2013-08-13 DIAGNOSIS — J309 Allergic rhinitis, unspecified: Secondary | ICD-10-CM

## 2013-08-13 DIAGNOSIS — M549 Dorsalgia, unspecified: Secondary | ICD-10-CM

## 2013-08-13 MED ORDER — CETIRIZINE HCL 10 MG PO TABS: 10.0000 mg | ORAL_TABLET | Freq: Every day | ORAL | Status: DC

## 2013-08-13 MED ORDER — NEBIVOLOL HCL 10 MG PO TABS: 10.0000 mg | ORAL_TABLET | Freq: Every day | ORAL | Status: DC

## 2013-08-13 MED ORDER — METHYLPREDNISOLONE ACETATE 80 MG/ML IJ SUSP
120.0000 mg | Freq: Once | INTRAMUSCULAR | Status: AC
  Administered 2013-08-13: 120 mg via INTRAMUSCULAR

## 2013-08-13 MED ORDER — AZELASTINE-FLUTICASONE 137-50 MCG/ACT NA SUSP: 1.0000 | Freq: Two times a day (BID) | NASAL | Status: DC

## 2013-08-13 NOTE — Assessment & Plan Note (Signed)
He has tried motrin and tramadol without much relief He wants to see a specialist so I have referred him to PMR (T. Saullo)

## 2013-08-13 NOTE — Assessment & Plan Note (Signed)
He is having a flare up of his symptoms so I gave him an injection of depo-medrol IM Also, I have asked him to start dymista and zyrtec

## 2013-08-13 NOTE — Assessment & Plan Note (Signed)
Will refer to pain management

## 2013-08-13 NOTE — Progress Notes (Signed)
Subjective:    Patient ID: [name] [name], male    DOB: 23-Jun-1970, 43 y.o.   MRN: [id number]  Hypertension This is a chronic problem. The current episode started more than 1 year ago. The problem has been gradually worsening since onset. The problem is uncontrolled. Pertinent negatives include no anxiety, blurred vision, chest pain, headaches, malaise/fatigue, neck pain, orthopnea, palpitations, peripheral edema, PND, shortness of breath or sweats. Agents associated with hypertension include NSAIDs. Past treatments include beta blockers. The current treatment provides mild improvement. Compliance problems include exercise and diet.       Review of Systems  Constitutional: Negative.  Negative for fever, chills, malaise/fatigue, diaphoresis, appetite change and fatigue.  HENT: Positive for congestion, postnasal drip, rhinorrhea and sneezing. Negative for facial swelling, hearing loss, mouth sores, nosebleeds, sinus pressure, sore throat, tinnitus, trouble swallowing and voice change.   Eyes: Negative.  Negative for blurred vision.  Respiratory: Negative.  Negative for cough, choking, chest tightness, shortness of breath and stridor.   Cardiovascular: Negative.  Negative for chest pain, palpitations, orthopnea and PND.  Gastrointestinal: Negative.  Negative for nausea, vomiting, abdominal pain, diarrhea, constipation and blood in stool.  Endocrine: Negative.   Genitourinary: Negative.   Musculoskeletal: Positive for back pain. Negative for arthralgias, gait problem, joint swelling, myalgias, neck pain and neck stiffness.  Skin: Negative.   Allergic/Immunologic: Negative.   Neurological: Negative.  Negative for dizziness, tremors, weakness, light-headedness, numbness and headaches.  Hematological: Negative.  Negative for adenopathy. Does not bruise/bleed easily.  Psychiatric/Behavioral: Negative.        Objective:   Physical Exam  Vitals reviewed. Constitutional: He is oriented to  person, place, and time. He appears well-developed and well-nourished. No distress.  HENT:  Right Ear: Hearing, tympanic membrane, external ear and ear canal normal.  Left Ear: Hearing, tympanic membrane, external ear and ear canal normal.  Nose: Mucosal edema present. No rhinorrhea or sinus tenderness. No epistaxis.  No foreign bodies. Right sinus exhibits no maxillary sinus tenderness and no frontal sinus tenderness. Left sinus exhibits no maxillary sinus tenderness and no frontal sinus tenderness.  Mouth/Throat: Oropharynx is clear and moist and mucous membranes are normal. Mucous membranes are not pale, not dry and not cyanotic. No oral lesions. No trismus in the jaw. No uvula swelling. No oropharyngeal exudate, posterior oropharyngeal edema or tonsillar abscesses.  Eyes: Conjunctivae are normal. Right eye exhibits no discharge. Left eye exhibits no discharge. No scleral icterus.  Neck: Normal range of motion. Neck supple. No JVD present. No tracheal deviation present. No thyromegaly present.  Cardiovascular: Normal rate, regular rhythm, normal heart sounds and intact distal pulses.  Exam reveals no gallop and no friction rub.   No murmur heard. Pulmonary/Chest: Breath sounds normal. No stridor. No respiratory distress. He has no wheezes. He has no rales. He exhibits no tenderness.  Abdominal: Soft. Bowel sounds are normal. He exhibits no distension and no mass. There is no tenderness. There is no rebound and no guarding.  Musculoskeletal: Normal range of motion. He exhibits no edema and no tenderness.  Lymphadenopathy:    He has no cervical adenopathy.  Neurological: He is oriented to person, place, and time.  Skin: Skin is warm and dry. No rash noted. He is not diaphoretic. No erythema. No pallor.  Psychiatric: He has a normal mood and affect. His behavior is normal. Thought content normal.     Lab Results  Component Value Date   WBC 8.5 04/09/2013   HGB 15.4 04/09/2013  HCT 41.7  04/09/2013   PLT 346 04/09/2013   GLUCOSE 93 04/23/2013   CHOL 170 01/15/2013   TRIG 382.0* 01/15/2013   HDL 27.40* 01/15/2013   LDLDIRECT 98.3 01/15/2013   LDLCALC 116* 08/13/2011   ALT 22 12/21/2012   AST 21 12/21/2012   NA 137 04/23/2013   K 3.5 04/23/2013   CL 102 04/23/2013   CREATININE 1.0 04/23/2013   BUN 9 04/23/2013   CO2 29 04/23/2013   TSH 3.22 01/15/2013   PSA 0.73 01/15/2013   INR 0.91 12/21/2012   HGBA1C 5.7 04/23/2013       Assessment & Plan:

## 2013-08-13 NOTE — Patient Instructions (Signed)

## 2013-08-13 NOTE — Assessment & Plan Note (Signed)
His BP is not well controlled I have increased the dose of bystolic Will recheck his BP in 1 month I have asked him to improve his lifestyle modifications

## 2013-08-13 NOTE — Progress Notes (Signed)
Pre visit review using our clinic review tool, if applicable. No additional management support is needed unless otherwise documented below in the visit note. 

## 2013-08-14 ENCOUNTER — Telehealth: Payer: Self-pay | Admitting: Internal Medicine

## 2013-08-14 NOTE — Telephone Encounter (Signed)
Relevant patient education assigned to patient using Emmi. ° °

## 2013-08-20 ENCOUNTER — Ambulatory Visit: Payer: 59 | Admitting: Family Medicine

## 2013-08-20 ENCOUNTER — Telehealth: Payer: Self-pay | Admitting: Nurse Practitioner

## 2013-08-20 NOTE — Telephone Encounter (Signed)
Called pt to schedule an appt on 08/22/13 with Dr. Marjory LiesPenumalli. Pt stated that he needed to come in today and I explained to the pt that Dr. Marjory LiesPenumalli did not have any openings. Pt stated that he was going to the ER, because his migraines was so bad. Pt wanted to know if Dr. Marjory LiesPenumalli could call him to advise him on what he could do between now and 08/22/13, Wednesday.  Please advise

## 2013-08-20 NOTE — Telephone Encounter (Signed)
pls schedule earlier appt this week with me or [name] [name]. -VRP

## 2013-08-20 NOTE — Telephone Encounter (Signed)
Pt called again and wanted to know if there is any way he can get in today. He says he definitely needs to be seen before the 11-09-13 appointment.  Please call

## 2013-08-20 NOTE — Telephone Encounter (Signed)
Pt is calling stating that he is having very bad migraines and nothing seems to be helping. Pt would like to know if he could come in. Pt's next appt is on 11/09/13 with Larita FifeLynn, NP. Larita FifeLynn, NP is out of the office. Dr. Marjory LiesPenumalli pt. Please advise

## 2013-08-22 ENCOUNTER — Encounter (INDEPENDENT_AMBULATORY_CARE_PROVIDER_SITE_OTHER): Payer: Self-pay

## 2013-08-22 ENCOUNTER — Ambulatory Visit (INDEPENDENT_AMBULATORY_CARE_PROVIDER_SITE_OTHER): Payer: 59 | Admitting: Diagnostic Neuroimaging

## 2013-08-22 ENCOUNTER — Encounter: Payer: Self-pay | Admitting: Diagnostic Neuroimaging

## 2013-08-22 VITALS — BP 144/94 | HR 67 | Ht 71.75 in | Wt 276.0 lb

## 2013-08-22 DIAGNOSIS — G43009 Migraine without aura, not intractable, without status migrainosus: Secondary | ICD-10-CM

## 2013-08-22 MED ORDER — ALSUMA 6 MG/0.5ML ~~LOC~~ SOAJ: 6.0000 mg | Freq: Once | SUBCUTANEOUS | Status: DC

## 2013-08-22 MED ORDER — TOPIRAMATE 50 MG PO TABS: 50.0000 mg | ORAL_TABLET | Freq: Two times a day (BID) | ORAL | Status: DC

## 2013-08-22 NOTE — Patient Instructions (Signed)
Increase topiramate to 50mg  twice a day  Try alsuma injections as needed for HA
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
## 2013-08-24 ENCOUNTER — Telehealth: Payer: Self-pay | Admitting: Diagnostic Neuroimaging

## 2013-08-24 MED ORDER — SUMATRIPTAN SUCCINATE 6 MG/0.5ML ~~LOC~~ SOLN: 6.0000 mg | SUBCUTANEOUS | Status: DC | PRN

## 2013-08-24 NOTE — Telephone Encounter (Signed)
Pt called does not want to get the medication ALSUMA 6 MG/0.5ML SOAJ says it is to expensive from his insurance. Pt states Dr. Marjory LiesPenumalli advised him of another medication but he couldn't remember what it was. Please call pt concerning this matter. Thanks.

## 2013-08-24 NOTE — Telephone Encounter (Signed)
I called the patient back and advised we have changed Rx to generic Sumatriptan.  He verbalized understanding.

## 2013-08-24 NOTE — Telephone Encounter (Signed)
Patient does not wish to use Alsuma, as his co-pay is not affordable.  Unfortunately, there is not a generic available for Alsuma.  He would like to know if something else could be prescribed in it's place.  Please advise.  Thank you.

## 2013-08-27 ENCOUNTER — Institutional Professional Consult (permissible substitution): Payer: 59 | Admitting: Interventional Cardiology

## 2013-08-27 ENCOUNTER — Telehealth: Payer: Self-pay | Admitting: Interventional Cardiology

## 2013-08-27 NOTE — Telephone Encounter (Signed)
New message          Pt has a cough and wants to know if he still should come into the office to for his np appt to see dr Katrinka Blazingsmith???? appt is today at 2:30.

## 2013-08-27 NOTE — Telephone Encounter (Signed)
returned pt call.pr sts that he is sick with a colsd and rqst resch.new pt appt made with Dr.Smith for 09/20/13.pt aware and verbalized understanding.

## 2013-09-14 ENCOUNTER — Ambulatory Visit: Payer: 59 | Admitting: Internal Medicine

## 2013-09-17 ENCOUNTER — Telehealth: Payer: Self-pay | Admitting: Diagnostic Neuroimaging

## 2013-09-17 NOTE — Telephone Encounter (Signed)
Having anxiety attacks that he thinks is because of his medications but doesn't now which one.

## 2013-09-17 NOTE — Telephone Encounter (Signed)
Pt calling stating that he is having anxiety attacks and believes it is coming from one of his medications but does not know which one. Please advise

## 2013-09-20 ENCOUNTER — Encounter: Payer: Self-pay | Admitting: Interventional Cardiology

## 2013-09-20 ENCOUNTER — Ambulatory Visit (INDEPENDENT_AMBULATORY_CARE_PROVIDER_SITE_OTHER): Payer: 59 | Admitting: Interventional Cardiology

## 2013-09-20 VITALS — BP 158/97 | HR 75 | Ht 71.75 in | Wt 273.0 lb

## 2013-09-20 DIAGNOSIS — E669 Obesity, unspecified: Secondary | ICD-10-CM

## 2013-09-20 DIAGNOSIS — R55 Syncope and collapse: Secondary | ICD-10-CM

## 2013-09-20 DIAGNOSIS — G43009 Migraine without aura, not intractable, without status migrainosus: Secondary | ICD-10-CM

## 2013-09-20 DIAGNOSIS — I10 Essential (primary) hypertension: Secondary | ICD-10-CM

## 2013-09-20 DIAGNOSIS — F0781 Postconcussional syndrome: Secondary | ICD-10-CM
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
## 2013-09-20 NOTE — Patient Instructions (Signed)
Your physician recommends that you continue on your current medications as directed. Please refer to the Current Medication list given to you today.  Your physician has recommended that you wear an event monitor. Event monitors are medical devices that record the heart's electrical activity. Doctors most often us these monitors to diagnose arrhythmias. Arrhythmias are problems with the speed or rhythm of the heartbeat. The monitor is a small, portable device. You can wear one while you do your normal daily activities. This is usually used to diagnose what is causing palpitations/syncope (passing out).  Your physician has requested that you have an echocardiogram. Echocardiography is a painless test that uses sound waves to create images of your heart. It provides your doctor with information about the size and shape of your heart and how well your heart's chambers and valves are working. This procedure takes approximately one hour. There are no restrictions for this procedure.  Follow up pending results
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
## 2013-09-25 ENCOUNTER — Encounter (INDEPENDENT_AMBULATORY_CARE_PROVIDER_SITE_OTHER): Payer: 59

## 2013-09-25 ENCOUNTER — Encounter: Payer: Self-pay | Admitting: *Deleted

## 2013-09-25 DIAGNOSIS — R55 Syncope and collapse: Secondary | ICD-10-CM

## 2013-09-25 NOTE — Progress Notes (Signed)
Patient ID: [name] [name], male   DOB: 1970-07-23, 43 y.o.   MRN: [id number] [employer] 30 day cardiac event monitor applied to patient.

## 2013-10-04 ENCOUNTER — Ambulatory Visit (HOSPITAL_COMMUNITY): Payer: 59 | Attending: Internal Medicine | Admitting: Radiology

## 2013-10-04 DIAGNOSIS — E785 Hyperlipidemia, unspecified: Secondary | ICD-10-CM | POA: Insufficient documentation

## 2013-10-04 DIAGNOSIS — I10 Essential (primary) hypertension: Secondary | ICD-10-CM | POA: Insufficient documentation

## 2013-10-04 DIAGNOSIS — I079 Rheumatic tricuspid valve disease, unspecified: Secondary | ICD-10-CM | POA: Insufficient documentation

## 2013-10-04 DIAGNOSIS — R55 Syncope and collapse: Secondary | ICD-10-CM | POA: Insufficient documentation

## 2013-10-04 DIAGNOSIS — E669 Obesity, unspecified: Secondary | ICD-10-CM | POA: Insufficient documentation

## 2013-10-04 NOTE — Progress Notes (Signed)
Echocardiogram performed.  

## 2013-10-11 ENCOUNTER — Telehealth: Payer: Self-pay

## 2013-10-11 NOTE — Telephone Encounter (Signed)
Message copied by Jarvis NewcomerPARRIS-GODLEY, Nakai Yard S on Thu Oct 11, 2013 10:33 AM ------      Message from: Verdis PrimeSMITH, HENRY      Created: Wed Oct 10, 2013  8:13 AM       Structurally normal study ------

## 2013-10-11 NOTE — Telephone Encounter (Signed)
Message copied by Jarvis NewcomerPARRIS-GODLEY, Casondra Gasca S on Thu Oct 11, 2013  9:15 AM ------      Message from: Verdis PrimeSMITH, HENRY      Created: Wed Oct 10, 2013  8:13 AM       Structurally normal study ------

## 2013-10-11 NOTE — Telephone Encounter (Signed)
pt aware of echo results.  Structurally normal study .pt verbalized understanding.

## 2013-10-11 NOTE — Telephone Encounter (Signed)
Message copied by Jarvis NewcomerPARRIS-GODLEY, Zofia Peckinpaugh S on Thu Oct 11, 2013 10:34 AM ------      Message from: Verdis PrimeSMITH, HENRY      Created: Wed Oct 10, 2013  8:13 AM       Structurally normal study ------

## 2013-10-11 NOTE — Telephone Encounter (Signed)
Follow up ° ° ° ° ° °Returning Lisa's call °

## 2013-10-11 NOTE — Telephone Encounter (Signed)
called to give pt echo results.lmom for pt to call back 

## 2013-10-12 ENCOUNTER — Other Ambulatory Visit (INDEPENDENT_AMBULATORY_CARE_PROVIDER_SITE_OTHER): Payer: 59

## 2013-10-12 ENCOUNTER — Encounter: Payer: Self-pay | Admitting: Internal Medicine

## 2013-10-12 ENCOUNTER — Ambulatory Visit (INDEPENDENT_AMBULATORY_CARE_PROVIDER_SITE_OTHER): Payer: 59 | Admitting: Internal Medicine

## 2013-10-12 VITALS — BP 144/96 | HR 81 | Temp 98.7°F | Resp 16 | Ht 70.0 in | Wt 274.5 lb

## 2013-10-12 DIAGNOSIS — E782 Mixed hyperlipidemia: Secondary | ICD-10-CM

## 2013-10-12 DIAGNOSIS — I10 Essential (primary) hypertension: Secondary | ICD-10-CM

## 2013-10-12 DIAGNOSIS — E876 Hypokalemia: Secondary | ICD-10-CM

## 2013-10-12 DIAGNOSIS — R7309 Other abnormal glucose: Secondary | ICD-10-CM

## 2013-10-12 LAB — LIPID PANEL
CHOLESTEROL: 198 mg/dL (ref 0–200)
HDL: 36.2 mg/dL — AB (ref >39.00)
LDL Cholesterol: 131 mg/dL — ABNORMAL HIGH (ref 0–99)
Total CHOL/HDL Ratio: 5
Triglycerides: 156 mg/dL — ABNORMAL HIGH (ref 0.0–149.0)
VLDL: 31.2 mg/dL (ref 0.0–40.0)

## 2013-10-12 LAB — BASIC METABOLIC PANEL
BUN: 9 mg/dL (ref 6–23)
CALCIUM: 8.9 mg/dL (ref 8.4–10.5)
CO2: 29 mEq/L (ref 19–32)
Chloride: 100 mEq/L (ref 96–112)
Creatinine, Ser: 1 mg/dL (ref 0.4–1.5)
GFR: 99.78 mL/min (ref >60.00)
GLUCOSE: 100 mg/dL — AB (ref 70–99)
POTASSIUM: 3 meq/L — AB (ref 3.5–5.1)
SODIUM: 138 meq/L (ref 135–145)

## 2013-10-12 LAB — URINALYSIS, ROUTINE W REFLEX MICROSCOPIC
Bilirubin Urine: NEGATIVE
Hgb urine dipstick: NEGATIVE
KETONES UR: NEGATIVE
Leukocytes, UA: NEGATIVE
NITRITE: NEGATIVE
PH: 6.5 (ref 5.0–8.0)
SPECIFIC GRAVITY, URINE: 1.015 (ref 1.000–1.030)
TOTAL PROTEIN, URINE-UPE24: NEGATIVE
Urine Glucose: NEGATIVE
Urobilinogen, UA: 0.2 (ref 0.0–1.0)

## 2013-10-12 LAB — HEMOGLOBIN A1C: Hgb A1c MFr Bld: 5.8 % (ref 4.6–6.5)

## 2013-10-12 MED ORDER — AZILSARTAN-CHLORTHALIDONE 40-12.5 MG PO TABS: 1.0000 | ORAL_TABLET | Freq: Every day | ORAL | Status: DC

## 2013-10-12 MED ORDER — POTASSIUM CHLORIDE CRYS ER 20 MEQ PO TBCR: 20.0000 meq | EXTENDED_RELEASE_TABLET | Freq: Two times a day (BID) | ORAL | Status: DC

## 2013-10-12 NOTE — Progress Notes (Signed)
Subjective:    Patient ID: [name] [name], male    DOB: Feb 19, 1971, 43 y.o.   MRN: [id number]  Hypertension This is a chronic problem. The current episode started more than 1 year ago. The problem is unchanged. The problem is uncontrolled. Associated symptoms include headaches and palpitations. Pertinent negatives include no anxiety, blurred vision, chest pain, malaise/fatigue, neck pain, orthopnea, peripheral edema, PND, shortness of breath or sweats. Agents associated with hypertension include NSAIDs. Risk factors for coronary artery disease include obesity. Past treatments include beta blockers. The current treatment provides moderate improvement. Compliance problems include diet and exercise.       Review of Systems  Constitutional: Negative.  Negative for fever, chills, malaise/fatigue, diaphoresis, appetite change and fatigue.  Eyes: Negative.  Negative for blurred vision.  Respiratory: Negative.  Negative for cough, choking, chest tightness, shortness of breath and stridor.   Cardiovascular: Positive for palpitations. Negative for chest pain, orthopnea, leg swelling and PND.  Gastrointestinal: Negative.  Negative for nausea, vomiting, abdominal pain, diarrhea, constipation and blood in stool.  Endocrine: Negative.   Genitourinary: Negative.   Musculoskeletal: Negative.  Negative for arthralgias and neck pain.  Skin: Negative.   Allergic/Immunologic: Negative.   Neurological: Positive for dizziness and headaches. Negative for tremors, seizures, syncope, facial asymmetry, speech difficulty, weakness, light-headedness and numbness.  Hematological: Negative.  Negative for adenopathy. Does not bruise/bleed easily.  Psychiatric/Behavioral: Negative.        Objective:   Physical Exam  Vitals reviewed. Constitutional: He is oriented to person, place, and time. He appears well-developed and well-nourished. No distress.  HENT:  Head: Normocephalic and atraumatic.  Mouth/Throat:  Oropharynx is clear and moist. No oropharyngeal exudate.  Eyes: Conjunctivae are normal. Right eye exhibits no discharge. Left eye exhibits no discharge. No scleral icterus.  Neck: Normal range of motion. Neck supple. No JVD present. No tracheal deviation present. No thyromegaly present.  Cardiovascular: Normal rate, regular rhythm, normal heart sounds and intact distal pulses.  Exam reveals no gallop and no friction rub.   No murmur heard. Pulmonary/Chest: Effort normal and breath sounds normal. No stridor. No respiratory distress. He has no wheezes. He has no rales. He exhibits no tenderness.  Abdominal: Soft. Bowel sounds are normal. He exhibits no distension and no mass. There is no tenderness. There is no rebound and no guarding.  Musculoskeletal: Normal range of motion. He exhibits no edema and no tenderness.  Lymphadenopathy:    He has no cervical adenopathy.  Neurological: He is oriented to person, place, and time.  Skin: Skin is warm and dry. No rash noted. He is not diaphoretic. No erythema. No pallor.  Psychiatric: He has a normal mood and affect. His behavior is normal. Judgment and thought content normal.     Lab Results  Component Value Date   WBC 8.5 04/09/2013   HGB 15.4 04/09/2013   HCT 41.7 04/09/2013   PLT 346 04/09/2013   GLUCOSE 93 04/23/2013   CHOL 170 01/15/2013   TRIG 382.0* 01/15/2013   HDL 27.40* 01/15/2013   LDLDIRECT 98.3 01/15/2013   LDLCALC 116* 08/13/2011   ALT 22 12/21/2012   AST 21 12/21/2012   NA 137 04/23/2013   K 3.5 04/23/2013   CL 102 04/23/2013   CREATININE 1.0 04/23/2013   BUN 9 04/23/2013   CO2 29 04/23/2013   TSH 3.22 01/15/2013   PSA 0.73 01/15/2013   INR 0.91 12/21/2012   HGBA1C 5.7 04/23/2013       Assessment & Plan:

## 2013-10-12 NOTE — Progress Notes (Signed)
Pre visit review using our clinic review tool, if applicable. No additional management support is needed unless otherwise documented below in the visit note. 

## 2013-10-12 NOTE — Patient Instructions (Signed)

## 2013-10-13 NOTE — Assessment & Plan Note (Signed)
I have asked him to restart K+ replacement therapy

## 2013-10-13 NOTE — Assessment & Plan Note (Signed)
Improvement noted in his trigs today

## 2013-10-13 NOTE — Assessment & Plan Note (Signed)
His BP is not well controlled I have asked him to add an ARB-HCTZ for better BP control I will monitor his lytes and renal function

## 2013-10-15 ENCOUNTER — Encounter: Payer: Self-pay | Admitting: Internal Medicine

## 2013-11-09 ENCOUNTER — Ambulatory Visit: Payer: 59 | Admitting: Nurse Practitioner

## 2013-11-15 ENCOUNTER — Telehealth: Payer: Self-pay

## 2013-11-15 NOTE — Telephone Encounter (Signed)
called to give pt cardiac monitor results.lmtcb 

## 2013-12-04 ENCOUNTER — Encounter: Payer: Self-pay | Admitting: Nurse Practitioner

## 2013-12-04 ENCOUNTER — Ambulatory Visit (INDEPENDENT_AMBULATORY_CARE_PROVIDER_SITE_OTHER): Payer: 59 | Admitting: Nurse Practitioner

## 2013-12-04 ENCOUNTER — Encounter (INDEPENDENT_AMBULATORY_CARE_PROVIDER_SITE_OTHER): Payer: Self-pay

## 2013-12-04 VITALS — BP 126/83 | HR 67 | Wt 276.8 lb

## 2013-12-04 DIAGNOSIS — G43009 Migraine without aura, not intractable, without status migrainosus: Secondary | ICD-10-CM

## 2013-12-04 MED ORDER — TOPIRAMATE 50 MG PO TABS: 100.0000 mg | ORAL_TABLET | Freq: Two times a day (BID) | ORAL | Status: DC

## 2013-12-04 MED ORDER — SUMAVEL DOSEPRO 6 MG/0.5ML ~~LOC~~ SOTJ: 1.0000 | SUBCUTANEOUS | Status: DC
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
## 2013-12-04 NOTE — Patient Instructions (Addendum)
Increase Topamax to 50 mg in the mornings and 100 mg at night.  After 1 month, increase to 100 mg in the morning and 100 mg at night.   In the future if this treatment is not decreasing the frequently and severity of the headaches, we may add a low dose SSRI anti-depressant (more for anxiety/stress reduction)  in the future or Botox injections.   Please keep a headache diary to chart your headaches and bring to next visit.  Recommended routine 45 min cardiovascular exercise, 4-5 days per week for stress reduction and overall health.  Follow up in 2-3 months, sooner as needed.

## 2013-12-06 NOTE — Progress Notes (Signed)
I reviewed note and agree with plan.   VIKRAM R. PENUMALLI, MD  Certified in Neurology, Neurophysiology and Neuroimaging  [facility] [address], [address] [place], [place] [address] [phone]   

## 2013-12-07 ENCOUNTER — Other Ambulatory Visit (INDEPENDENT_AMBULATORY_CARE_PROVIDER_SITE_OTHER): Payer: 59

## 2013-12-07 ENCOUNTER — Encounter: Payer: Self-pay | Admitting: Internal Medicine

## 2013-12-07 ENCOUNTER — Ambulatory Visit (INDEPENDENT_AMBULATORY_CARE_PROVIDER_SITE_OTHER): Payer: 59 | Admitting: Internal Medicine

## 2013-12-07 VITALS — BP 132/88 | HR 69 | Temp 98.4°F | Resp 16 | Ht 70.0 in | Wt 277.8 lb

## 2013-12-07 DIAGNOSIS — I10 Essential (primary) hypertension: Secondary | ICD-10-CM

## 2013-12-07 DIAGNOSIS — E876 Hypokalemia: Secondary | ICD-10-CM

## 2013-12-07 LAB — BASIC METABOLIC PANEL
BUN: 6 mg/dL (ref 6–23)
CO2: 31 mEq/L (ref 19–32)
Calcium: 9.5 mg/dL (ref 8.4–10.5)
Chloride: 103 mEq/L (ref 96–112)
Creatinine, Ser: 1 mg/dL (ref 0.4–1.5)
GFR: 99.71 mL/min (ref >60.00)
Glucose, Bld: 100 mg/dL — ABNORMAL HIGH (ref 70–99)
Potassium: 4.2 mEq/L (ref 3.5–5.1)
SODIUM: 140 meq/L (ref 135–145)

## 2013-12-07 LAB — MAGNESIUM: MAGNESIUM: 2.1 mg/dL (ref 1.5–2.5)

## 2013-12-07 NOTE — Patient Instructions (Signed)
Hypertension Hypertension, commonly called high blood pressure, is when the force of blood pumping through your arteries is too strong. Your arteries are the blood vessels that carry blood from your heart throughout your body. A blood pressure reading consists of a higher number over a lower number, such as 110/72. The higher number (systolic) is the pressure inside your arteries when your heart pumps. The lower number (diastolic) is the pressure inside your arteries when your heart relaxes. Ideally you want your blood pressure below 120/80. Hypertension forces your heart to work harder to pump blood. Your arteries may become narrow or stiff. Having hypertension puts you at risk for heart disease, stroke, and other problems.  RISK FACTORS Some risk factors for high blood pressure are controllable. Others are not.  Risk factors you cannot control include:   Race. You may be at higher risk if you are African American.  Age. Risk increases with age.  Gender. Men are at higher risk than women before age 45 years. After age 65, women are at higher risk than men. Risk factors you can control include:  Not getting enough exercise or physical activity.  Being overweight.  Getting too much fat, sugar, calories, or salt in your diet.  Drinking too much alcohol. SIGNS AND SYMPTOMS Hypertension does not usually cause signs or symptoms. Extremely high blood pressure (hypertensive crisis) may cause headache, anxiety, shortness of breath, and nosebleed. DIAGNOSIS  To check if you have hypertension, your health care provider will measure your blood pressure while you are seated, with your arm held at the level of your heart. It should be measured at least twice using the same arm. Certain conditions can cause a difference in blood pressure between your right and left arms. A blood pressure reading that is higher than normal on one occasion does not mean that you need treatment. If one blood pressure reading  is high, ask your health care provider about having it checked again. TREATMENT  Treating high blood pressure includes making lifestyle changes and possibly taking medication. Living a healthy lifestyle can help lower high blood pressure. You may need to change some of your habits. Lifestyle changes may include:  Following the DASH diet. This diet is high in fruits, vegetables, and whole grains. It is low in salt, red meat, and added sugars.  Getting at least 2 1/2 hours of brisk physical activity every week.  Losing weight if necessary.  Not smoking.  Limiting alcoholic beverages.  Learning ways to reduce stress. If lifestyle changes are not enough to get your blood pressure under control, your health care provider may prescribe medicine. You may need to take more than one. Work closely with your health care provider to understand the risks and benefits. HOME CARE INSTRUCTIONS  Have your blood pressure rechecked as directed by your health care provider.   Only take medicine as directed by your health care provider. Follow the directions carefully. Blood pressure medicines must be taken as prescribed. The medicine does not work as well when you skip doses. Skipping doses also puts you at risk for problems.   Do not smoke.   Monitor your blood pressure at home as directed by your health care provider. SEEK MEDICAL CARE IF:   You think you are having a reaction to medicines taken.  You have recurrent headaches or feel dizzy.  You have swelling in your ankles.  You have trouble with your vision. SEEK IMMEDIATE MEDICAL CARE IF:  You develop a severe headache or   confusion.  You have unusual weakness, numbness, or feel faint.  You have severe chest or abdominal pain.  You vomit repeatedly.  You have trouble breathing. MAKE SURE YOU:   Understand these instructions.  Will watch your condition.  Will get help right away if you are not doing well or get  worse. Document Released: 05/17/2005 Document Revised: 05/22/2013 Document Reviewed: 03/09/2013 Exit[employer] Patient Information 2015 [employer]. This information is not intended to replace advice given to you by your health care provider. Make sure you discuss any questions you have with your health care provider.  

## 2013-12-07 NOTE — Assessment & Plan Note (Signed)
His BP is well controlled I will monitor his lytes and renal function today 

## 2013-12-07 NOTE — Progress Notes (Signed)
Pre visit review using our clinic review tool, if applicable. No additional management support is needed unless otherwise documented below in the visit note. 

## 2013-12-07 NOTE — Progress Notes (Signed)
   Subjective:    Patient ID: [name] [name], male    DOB: 1970-08-08, 43 y.o.   MRN: [id number]  Hypertension This is a chronic problem. The current episode started more than 1 year ago. The problem has been gradually improving since onset. The problem is controlled. Pertinent negatives include no anxiety, blurred vision, chest pain, headaches, malaise/fatigue, neck pain, orthopnea, palpitations, PND, shortness of breath or sweats. Agents associated with hypertension include NSAIDs. Past treatments include beta blockers, angiotensin blockers and diuretics. The current treatment provides moderate improvement. Compliance problems include exercise and diet.       Review of Systems  Constitutional: Negative.  Negative for fever, chills, malaise/fatigue, diaphoresis, appetite change and fatigue.  HENT: Negative.   Eyes: Negative.  Negative for blurred vision.  Respiratory: Negative.  Negative for cough, choking, chest tightness, shortness of breath, wheezing and stridor.   Cardiovascular: Negative.  Negative for chest pain, palpitations, orthopnea, leg swelling and PND.  Gastrointestinal: Negative.  Negative for nausea, vomiting, abdominal pain, diarrhea, constipation and blood in stool.  Endocrine: Negative.   Genitourinary: Negative.   Musculoskeletal: Negative.  Negative for neck pain.  Skin: Negative.   Allergic/Immunologic: Negative.   Neurological: Negative.  Negative for dizziness, syncope, speech difficulty, weakness, light-headedness, numbness and headaches.  Hematological: Negative.  Negative for adenopathy. Does not bruise/bleed easily.  Psychiatric/Behavioral: Negative.        Objective:   Physical Exam  Vitals reviewed. Constitutional: He is oriented to person, place, and time. He appears well-developed and well-nourished. No distress.  HENT:  Head: Normocephalic and atraumatic.  Mouth/Throat: Oropharynx is clear and moist. No oropharyngeal exudate.  Eyes: Conjunctivae  are normal. Right eye exhibits no discharge. Left eye exhibits no discharge. No scleral icterus.  Neck: Normal range of motion. Neck supple. No JVD present. No tracheal deviation present. No thyromegaly present.  Cardiovascular: Normal rate, regular rhythm, normal heart sounds and intact distal pulses.  Exam reveals no gallop and no friction rub.   No murmur heard. Pulmonary/Chest: Effort normal and breath sounds normal. No stridor. No respiratory distress. He has no wheezes. He has no rales. He exhibits no tenderness.  Abdominal: Soft. Bowel sounds are normal. He exhibits no distension and no mass. There is no tenderness. There is no rebound and no guarding.  Musculoskeletal: Normal range of motion. He exhibits no edema and no tenderness.  Lymphadenopathy:    He has no cervical adenopathy.  Neurological: He is oriented to person, place, and time.  Skin: Skin is warm and dry. No rash noted. He is not diaphoretic. No erythema. No pallor.     Lab Results  Component Value Date   WBC 8.5 04/09/2013   HGB 15.4 04/09/2013   HCT 41.7 04/09/2013   PLT 346 04/09/2013   GLUCOSE 100* 10/12/2013   CHOL 198 10/12/2013   TRIG 156.0* 10/12/2013   HDL 36.20* 10/12/2013   LDLDIRECT 98.3 01/15/2013   LDLCALC 131* 10/12/2013   ALT 22 12/21/2012   AST 21 12/21/2012   NA 138 10/12/2013   K 3.0* 10/12/2013   CL 100 10/12/2013   CREATININE 1.0 10/12/2013   BUN 9 10/12/2013   CO2 29 10/12/2013   TSH 3.22 01/15/2013   PSA 0.73 01/15/2013   INR 0.91 12/21/2012   HGBA1C 5.8 10/12/2013       Assessment & Plan:

## 2013-12-07 NOTE — Assessment & Plan Note (Signed)
I will recheck his K+ and Mg++ levels today

## 2014-01-11 DIAGNOSIS — Z0289 Encounter for other administrative examinations: Secondary | ICD-10-CM

## 2014-01-30 ENCOUNTER — Telehealth: Payer: Self-pay | Admitting: Diagnostic Neuroimaging

## 2014-01-30 NOTE — Telephone Encounter (Signed)
Faxed on 01/30/14.

## 2014-01-30 NOTE — Telephone Encounter (Signed)
Faxed patient form on 01/30/14.

## 2014-03-06 ENCOUNTER — Ambulatory Visit (INDEPENDENT_AMBULATORY_CARE_PROVIDER_SITE_OTHER): Payer: 59 | Admitting: Nurse Practitioner

## 2014-03-06 ENCOUNTER — Encounter: Payer: Self-pay | Admitting: Nurse Practitioner

## 2014-03-06 ENCOUNTER — Encounter (INDEPENDENT_AMBULATORY_CARE_PROVIDER_SITE_OTHER): Payer: Self-pay

## 2014-03-06 VITALS — BP 146/95 | HR 71 | Ht 71.0 in | Wt 277.0 lb

## 2014-03-06 DIAGNOSIS — F0781 Postconcussional syndrome: Secondary | ICD-10-CM

## 2014-03-06 DIAGNOSIS — G43711 Chronic migraine without aura, intractable, with status migrainosus: Secondary | ICD-10-CM

## 2014-03-06 DIAGNOSIS — G43009 Migraine without aura, not intractable, without status migrainosus: Secondary | ICD-10-CM

## 2014-03-06 MED ORDER — TOPIRAMATE 50 MG PO TABS: 50.0000 mg | ORAL_TABLET | Freq: Two times a day (BID) | ORAL | Status: DC

## 2014-03-06 MED ORDER — SUMATRIPTAN SUCCINATE 6 MG/0.5ML ~~LOC~~ SOLN: 6.0000 mg | SUBCUTANEOUS | Status: DC | PRN

## 2014-03-06 NOTE — Patient Instructions (Signed)
PLAN:  - Continue TPX 50 mg bid.  - Sumavel sq Injection, 6 mg as needed for breakthrough migraine. - keep a headache diary to chart your headaches and bring to next visit.  - Recommended routine 45 min cardiovascular exercise, 4-5 days per week for stress reduction and overall health.  - We will apply for Botox injections, it takes at least 4 weeks for approval, we will call you when it is approved to schedule your visit.
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
## 2014-03-11 ENCOUNTER — Other Ambulatory Visit: Payer: Self-pay

## 2014-03-11 MED ORDER — DEXLANSOPRAZOLE 60 MG PO CPDR: 60.0000 mg | DELAYED_RELEASE_CAPSULE | Freq: Every day | ORAL | Status: DC

## 2014-03-11 NOTE — Progress Notes (Signed)
I reviewed note and agree with plan.   VIKRAM R. PENUMALLI, MD  Certified in Neurology, Neurophysiology and Neuroimaging  [facility] [address], [address] [place], [place] [address] [phone]   

## 2014-03-14 ENCOUNTER — Telehealth: Payer: Self-pay

## 2014-03-14 MED ORDER — IBUPROFEN 800 MG PO TABS: 800.0000 mg | ORAL_TABLET | Freq: Three times a day (TID) | ORAL | Status: DC | PRN

## 2014-03-14 NOTE — Telephone Encounter (Signed)
Please advise if ok to refill ibuprofen 800 mg for this pt. Thanks

## 2014-03-14 NOTE — Telephone Encounter (Signed)
done

## 2014-03-19 ENCOUNTER — Other Ambulatory Visit: Payer: Self-pay | Admitting: Neurology

## 2014-03-19 DIAGNOSIS — G43709 Chronic migraine without aura, not intractable, without status migrainosus: Secondary | ICD-10-CM

## 2014-03-19 MED ORDER — ONABOTULINUMTOXINA 100 UNITS IJ SOLR: 200.0000 [IU] | INTRAMUSCULAR | Status: DC

## 2014-04-15 ENCOUNTER — Emergency Department (HOSPITAL_COMMUNITY)
Admission: EM | Admit: 2014-04-15 | Discharge: 2014-04-15 | Disposition: A | Discharge status: Home / Self Care | Payer: 59 | Source: Home / Self Care | Attending: Emergency Medicine | Admitting: Emergency Medicine

## 2014-04-15 ENCOUNTER — Encounter (HOSPITAL_COMMUNITY): Payer: Self-pay | Admitting: Emergency Medicine

## 2014-04-15 DIAGNOSIS — B9789 Other viral agents as the cause of diseases classified elsewhere: Principal | ICD-10-CM

## 2014-04-15 DIAGNOSIS — J0140 Acute pansinusitis, unspecified: Secondary | ICD-10-CM

## 2014-04-15 DIAGNOSIS — J069 Acute upper respiratory infection, unspecified: Secondary | ICD-10-CM

## 2014-04-15 MED ORDER — IPRATROPIUM BROMIDE 0.06 % NA SOLN: 2.0000 | Freq: Four times a day (QID) | NASAL | Status: DC

## 2014-04-15 MED ORDER — AMOXICILLIN-POT CLAVULANATE 875-125 MG PO TABS: 1.0000 | ORAL_TABLET | Freq: Two times a day (BID) | ORAL | Status: DC

## 2014-04-15 NOTE — ED Provider Notes (Signed)
CSN: [id number]     Arrival date & time 04/15/14  1556 History   First MD Initiated Contact with Patient 04/15/14 1720     Chief Complaint  Patient presents with  . URI   (Consider location/radiation/quality/duration/timing/severity/associated sxs/prior Treatment) HPI  He is a 43 year old man here for evaluation of cough and congestion. His symptoms started 4 days ago with a scratchy throat and sinus pressure. The throat resolved, but the sinus pressure has worsened. He reports nasal congestion and discharge that ranges in color from green to brown to blood-tinged. He has been having a dry cough. He denies any trouble breathing. He had a fever over the weekend. He reports some body aches. He states his ears feel stuffy. He has been trying Mucinex, DayQuil, brandy with no improvement. He also had some mild nausea and loose stool yesterday.  Past Medical History  Diagnosis Date  . Gallstone   . Chronic headaches   . Nephrolithiasis   . Hypertension   . Pancreatitis     gallstone related   Past Surgical History  Procedure Laterality Date  . Appendectomy    . Cholecystectomy    . Laparotomy     Family History  Problem Relation Age of Onset  . Arthritis    . Diabetes    . Cancer Neg Hx   . Depression Neg Hx   . Early death Neg Hx   . Heart disease Neg Hx   . Kidney disease Neg Hx   . Hyperlipidemia Neg Hx   . Stroke Neg Hx   . Hypertension Mother    History  Substance Use Topics  . Smoking status: Former Smoker    Quit date: 05/31/1994  . Smokeless tobacco: Never Used  . Alcohol Use: No    Review of Systems  Constitutional: Positive for fever and fatigue.  HENT: Positive for congestion, rhinorrhea and sinus pressure. Negative for ear pain, sore throat and trouble swallowing.   Respiratory: Positive for cough. Negative for shortness of breath.   Gastrointestinal: Positive for nausea and diarrhea. Negative for vomiting.    Allergies  Review of patient's allergies  indicates no known allergies.  Home Medications   Prior to Admission medications   Medication Sig Start Date End Date Taking? Authorizing Provider  amoxicillin-clavulanate (AUGMENTIN) 875-125 MG per tablet Take 1 tablet by mouth 2 (two) times daily. 04/15/14   Charm RingsErin J Gemayel Mascio, MD  Ascorbic Acid (VITAMIN C) 1000 MG tablet Take 1,000 mg by mouth daily.    Historical Provider, MD  Azelastine-Fluticasone (DYMISTA) 137-50 MCG/ACT SUSP Place 1 Act into the nose 2 (two) times daily. 08/13/13   Etta Grandchildhomas L Jones, MD  Azilsartan-Chlorthalidone (EDARBYCLOR) 40-12.5 MG TABS Take 1 tablet by mouth daily. 10/12/13   Etta Grandchildhomas L Jones, MD  b complex vitamins capsule Take 1 capsule by mouth daily.    Historical Provider, MD  Biotin 1000 MCG tablet Take 1,000 mcg by mouth 3 (three) times daily.    Historical Provider, MD  botulinum toxin Type A (BOTOX) 100 UNITS SOLR injection Inject 200 Units into the muscle every 3 (three) months. 03/19/14   Anson FretAntonia B Ahern, MD  cetirizine (ZYRTEC) 10 MG tablet Take 1 tablet (10 mg total) by mouth daily. 08/13/13   Etta Grandchildhomas L Jones, MD  cholecalciferol (VITAMIN D) 1000 UNITS tablet Take 1,000 Units by mouth daily.    Historical Provider, MD  dexlansoprazole (DEXILANT) 60 MG capsule Take 1 capsule (60 mg total) by mouth daily. 03/11/14   Etta Grandchildhomas L Jones,  MD  fish oil-omega-3 fatty acids 1000 MG capsule Take 1 g by mouth daily.    Historical Provider, MD  ibuprofen (ADVIL,MOTRIN) 800 MG tablet Take 1 tablet (800 mg total) by mouth every 8 (eight) hours as needed. 03/14/14   Etta Grandchildhomas L Jones, MD  ipratropium (ATROVENT) 0.06 % nasal spray Place 2 sprays into both nostrils 4 (four) times daily. 04/15/14   Charm RingsErin J Maha Fischel, MD  nebivolol (BYSTOLIC) 10 MG tablet Take 1 tablet (10 mg total) by mouth daily. 08/13/13   Etta Grandchildhomas L Jones, MD  potassium chloride SA (K-DUR,KLOR-CON) 20 MEQ tablet Take 1 tablet (20 mEq total) by mouth 2 (two) times daily. 10/12/13 10/12/14  Etta Grandchildhomas L Jones, MD  SUMAtriptan (IMITREX)  6 MG/0.5ML SOLN injection Inject 0.5 mLs (6 mg total) into the skin as needed for migraine or headache. May repeat in 2 hours if headache persists or recurs. 03/06/14   Ronal FearLynn E Lam, NP  SUMAVEL DOSEPRO 6 MG/0.5ML SOTJ Inject 0.5 mLs (6 mg total) into the skin as directed. Inject into thigh at onset of Migraine.  May repeat x 1 in 24 hours prn 12/04/13   Ronal FearLynn E Lam, NP  topiramate (TOPAMAX) 50 MG tablet Take 1 tablet (50 mg total) by mouth 2 (two) times daily. 03/06/14   Ronal FearLynn E Lam, NP  traMADol (ULTRAM) 50 MG tablet Take 1 tablet (50 mg total) by mouth every 8 (eight) hours as needed for pain. 12/22/12   Etta Grandchildhomas L Jones, MD   BP 161/109 mmHg  Pulse 80  Temp(Src) 98.6 F (37 C) (Oral)  Resp 18  SpO2 96% Physical Exam  Constitutional: He is oriented to person, place, and time. He appears well-developed and well-nourished. No distress.  Appears unwell.  HENT:  Head: Normocephalic and atraumatic.  Right Ear: Tympanic membrane and external ear normal.  Left Ear: Tympanic membrane and external ear normal.  Nose: Mucosal edema and rhinorrhea present. Right sinus exhibits maxillary sinus tenderness and frontal sinus tenderness. Left sinus exhibits maxillary sinus tenderness and frontal sinus tenderness.  Mouth/Throat: Mucous membranes are not dry. Posterior oropharyngeal erythema present. No oropharyngeal exudate.  Eyes: Conjunctivae are normal.  Neck: Neck supple.  Cardiovascular: Normal rate, regular rhythm and normal heart sounds.   No murmur heard. Pulmonary/Chest: Effort normal and breath sounds normal. No respiratory distress. He has no wheezes. He has no rales.  Lymphadenopathy:    He has no cervical adenopathy.  Neurological: He is alert and oriented to person, place, and time.    ED Course  Procedures (including critical care time) Labs Review Labs Reviewed - No data to display  Imaging Review No results found.   MDM   1. Viral URI with cough   2. Acute pansinusitis, recurrence  not specified    Will treat sinusitis with Augmentin for 10 days. Symptomatic treatment with Atrovent, cetirizine, nasal saline spray. Tylenol or Motrin as needed for headache, fevers, body aches. Reviewed reasons to return as in after visit summary.    Charm RingsErin J Mukund Weinreb, MD 04/15/14 1758

## 2014-04-15 NOTE — Discharge Instructions (Signed)
You have a virus and what looks like a sinus infection. Take Augmentin 1 pill twice a day for 10 days. Use Atrovent nasal spray 4 times a day. Use nasal saline spray as often as you can tolerate. Use Tylenol or Motrin for headaches, body aches, fever. Drink plenty of fluids. You should start to feel better by Wednesday or Thursday.  If you develop trouble breathing, severe vomiting, high fevers please go to the emergency room.

## 2014-04-15 NOTE — ED Notes (Signed)
Reports symptoms started Thursday. Itchy throat, sinus pressure, dry cough, back and joints aching. This am had an episode of nausea, vomiting, and diarrhea.  This has not continued.  Patient reports blowing green/brown from sinus, also blood tinged.

## 2014-04-24 ENCOUNTER — Encounter: Payer: Self-pay | Admitting: Internal Medicine

## 2014-04-24 ENCOUNTER — Ambulatory Visit (INDEPENDENT_AMBULATORY_CARE_PROVIDER_SITE_OTHER): Payer: 59 | Admitting: Internal Medicine

## 2014-04-24 VITALS — BP 124/84 | HR 65 | Temp 98.6°F | Ht 70.0 in | Wt 280.0 lb

## 2014-04-24 DIAGNOSIS — M5440 Lumbago with sciatica, unspecified side: Secondary | ICD-10-CM

## 2014-04-24 DIAGNOSIS — M545 Low back pain: Secondary | ICD-10-CM

## 2014-04-24 DIAGNOSIS — I10 Essential (primary) hypertension: Secondary | ICD-10-CM

## 2014-04-24 DIAGNOSIS — J301 Allergic rhinitis due to pollen: Secondary | ICD-10-CM

## 2014-04-24 DIAGNOSIS — M5430 Sciatica, unspecified side: Secondary | ICD-10-CM

## 2014-04-24 MED ORDER — TRAMADOL HCL 50 MG PO TABS: 100.0000 mg | ORAL_TABLET | Freq: Four times a day (QID) | ORAL | Status: DC | PRN

## 2014-04-24 MED ORDER — METHYLPREDNISOLONE ACETATE 40 MG/ML IJ SUSP
40.0000 mg | Freq: Once | INTRAMUSCULAR | Status: AC
  Administered 2014-04-24: 40 mg via INTRA_ARTICULAR

## 2014-04-24 MED ORDER — METHYLPREDNISOLONE ACETATE 80 MG/ML IJ SUSP
80.0000 mg | Freq: Once | INTRAMUSCULAR | Status: AC
  Administered 2014-04-24: 80 mg via INTRAMUSCULAR

## 2014-04-24 MED ORDER — NEBIVOLOL HCL 10 MG PO TABS: 10.0000 mg | ORAL_TABLET | Freq: Every day | ORAL | Status: DC

## 2014-04-24 NOTE — Progress Notes (Signed)
Pre visit review using our clinic review tool, if applicable. No additional management support is needed unless otherwise documented below in the visit note. 

## 2014-04-24 NOTE — Assessment & Plan Note (Signed)
Will cont tramadol as needed for pain 

## 2014-04-24 NOTE — Assessment & Plan Note (Signed)
His BP is well controlled 

## 2014-04-24 NOTE — Progress Notes (Signed)
Subjective:    Patient ID: [name] [name], male    DOB: 1971-05-18, 43 y.o.   MRN: [id number]  Sinusitis This is a new problem. The current episode started in the past 7 days. The problem is unchanged. There has been no fever. The fever has been present for less than 1 day. His pain is at a severity of 0/10. He is experiencing no pain. Associated symptoms include congestion, ear pain (pressure in both ears) and a sore throat. Pertinent negatives include no chills, coughing, diaphoresis, headaches, hoarse voice, neck pain, shortness of breath, sinus pressure, sneezing or swollen glands. Past treatments include antibiotics. The treatment provided mild relief.      Review of Systems  Constitutional: Negative.  Negative for fever, chills, diaphoresis, activity change, appetite change, fatigue and unexpected weight change.  HENT: Positive for congestion, ear pain (pressure in both ears), postnasal drip, rhinorrhea and sore throat. Negative for dental problem, drooling, ear discharge, facial swelling, hoarse voice, nosebleeds, sinus pressure, sneezing, tinnitus, trouble swallowing and voice change.   Eyes: Negative.   Respiratory: Negative.  Negative for cough, choking, chest tightness, shortness of breath and stridor.   Cardiovascular: Negative.  Negative for chest pain, palpitations and leg swelling.  Gastrointestinal: Negative.  Negative for nausea, vomiting, abdominal pain, diarrhea, constipation and blood in stool.  Endocrine: Negative.   Genitourinary: Negative.   Musculoskeletal: Positive for back pain (mild, intermittent pain that rarely radiates into his LLE, tramadol works well for this). Negative for myalgias, joint swelling, arthralgias, gait problem, neck pain and neck stiffness.  Skin: Negative.  Negative for rash.  Allergic/Immunologic: Negative.   Neurological: Negative.  Negative for headaches.  Hematological: Negative.  Negative for adenopathy. Does not bruise/bleed easily.    Psychiatric/Behavioral: Negative.        Objective:   Physical Exam  Constitutional: He is oriented to person, place, and time. He appears well-developed and well-nourished.  Non-toxic appearance. He does not have a sickly appearance. He does not appear ill. No distress.  HENT:  Head: Normocephalic and atraumatic.  Right Ear: Hearing, tympanic membrane, external ear and ear canal normal.  Left Ear: No drainage, swelling or tenderness. Tympanic membrane is erythematous. Tympanic membrane is not injected, not scarred, not perforated, not retracted and not bulging. Tympanic membrane mobility is normal. No decreased hearing is noted.  Nose: Mucosal edema and rhinorrhea present. No sinus tenderness, nasal deformity, septal deviation or nasal septal hematoma. Right sinus exhibits no maxillary sinus tenderness and no frontal sinus tenderness. Left sinus exhibits no maxillary sinus tenderness and no frontal sinus tenderness.  Mouth/Throat: Oropharynx is clear and moist and mucous membranes are normal. Mucous membranes are not pale, not dry and not cyanotic. No oral lesions. No trismus in the jaw. No uvula swelling. No oropharyngeal exudate, posterior oropharyngeal edema, posterior oropharyngeal erythema or tonsillar abscesses.  Eyes: Conjunctivae are normal. Right eye exhibits no discharge. Left eye exhibits no discharge. No scleral icterus.  Neck: Normal range of motion. Neck supple. No JVD present. No tracheal deviation present. No thyromegaly present.  Cardiovascular: Normal rate, regular rhythm, normal heart sounds and intact distal pulses.  Exam reveals no gallop and no friction rub.   No murmur heard. Pulmonary/Chest: Effort normal and breath sounds normal. No stridor. No respiratory distress. He has no wheezes. He has no rales. He exhibits no tenderness.  Abdominal: Soft. Bowel sounds are normal. He exhibits no distension and no mass. There is no tenderness. There is no rebound and no guarding.  Musculoskeletal: Normal range of motion. He exhibits no edema or tenderness.  Lymphadenopathy:    He has no cervical adenopathy.  Neurological: He is oriented to person, place, and time.  Skin: Skin is warm and dry. No rash noted. He is not diaphoretic. No erythema. No pallor.  Vitals reviewed.    Lab Results  Component Value Date   WBC 8.5 04/09/2013   HGB 15.4 04/09/2013   HCT 41.7 04/09/2013   PLT 346 04/09/2013   GLUCOSE 100* 12/07/2013   CHOL 198 10/12/2013   TRIG 156.0* 10/12/2013   HDL 36.20* 10/12/2013   LDLDIRECT 98.3 01/15/2013   LDLCALC 131* 10/12/2013   ALT 22 12/21/2012   AST 21 12/21/2012   NA 140 12/07/2013   K 4.2 12/07/2013   CL 103 12/07/2013   CREATININE 1.0 12/07/2013   BUN 6 12/07/2013   CO2 31 12/07/2013   TSH 3.22 01/15/2013   PSA 0.73 01/15/2013   INR 0.91 12/21/2012   HGBA1C 5.8 10/12/2013       Assessment & Plan:

## 2014-04-24 NOTE — Assessment & Plan Note (Signed)
He is having a flare of his symptoms with eustachian tube dysfunction so I gave him an injection of steroids

## 2014-04-24 NOTE — Patient Instructions (Signed)

## 2014-05-01 ENCOUNTER — Telehealth: Payer: Self-pay | Admitting: Neurology

## 2014-05-01 NOTE — Telephone Encounter (Signed)
Patient called back and we scheduled appointment, but after speaking with the [facility] he decided to wait to have injections. Due to financial reasons.

## 2014-05-01 NOTE — Telephone Encounter (Signed)
I have attempted to contact this patient on two different occassions to schedule botox injections on 04/11/14 I was able to leave a message for patient to call me to schedule, on 05/01/14 the number was not available and there was no option to leave a message.

## 2014-05-10 ENCOUNTER — Emergency Department (INDEPENDENT_AMBULATORY_CARE_PROVIDER_SITE_OTHER)
Admission: EM | Admit: 2014-05-10 | Discharge: 2014-05-10 | Disposition: A | Discharge status: Home / Self Care | Payer: 59 | Source: Home / Self Care | Attending: Family Medicine | Admitting: Family Medicine

## 2014-05-10 ENCOUNTER — Encounter (HOSPITAL_COMMUNITY): Payer: Self-pay | Admitting: Emergency Medicine

## 2014-05-10 ENCOUNTER — Telehealth (HOSPITAL_COMMUNITY): Payer: Self-pay | Admitting: Family Medicine

## 2014-05-10 DIAGNOSIS — I10 Essential (primary) hypertension: Secondary | ICD-10-CM

## 2014-05-10 DIAGNOSIS — E876 Hypokalemia: Secondary | ICD-10-CM

## 2014-05-10 DIAGNOSIS — R109 Unspecified abdominal pain: Secondary | ICD-10-CM

## 2014-05-10 LAB — CBC WITH DIFFERENTIAL/PLATELET
Basophils Absolute: 0.1 10*3/uL (ref 0.0–0.1)
Basophils Relative: 1 % (ref 0–1)
EOS PCT: 2 % (ref 0–5)
Eosinophils Absolute: 0.2 10*3/uL (ref 0.0–0.7)
HCT: 40.8 % (ref 39.0–52.0)
Hemoglobin: 14.5 g/dL (ref 13.0–17.0)
LYMPHS PCT: 31 % (ref 12–46)
Lymphs Abs: 2.4 10*3/uL (ref 0.7–4.0)
MCH: 27.8 pg (ref 26.0–34.0)
MCHC: 35.5 g/dL (ref 30.0–36.0)
MCV: 78.2 fL (ref 78.0–100.0)
MONOS PCT: 6 % (ref 3–12)
Monocytes Absolute: 0.5 10*3/uL (ref 0.1–1.0)
Neutro Abs: 4.4 10*3/uL (ref 1.7–7.7)
Neutrophils Relative %: 60 % (ref 43–77)
PLATELETS: 367 10*3/uL (ref 150–400)
RBC: 5.22 MIL/uL (ref 4.22–5.81)
RDW: 13.8 % (ref 11.5–15.5)
WBC: 7.6 10*3/uL (ref 4.0–10.5)

## 2014-05-10 LAB — COMPREHENSIVE METABOLIC PANEL
ALBUMIN: 4.1 g/dL (ref 3.5–5.2)
ALT: 32 U/L (ref 0–53)
AST: 24 U/L (ref 0–37)
Alkaline Phosphatase: 66 U/L (ref 39–117)
Anion gap: 14 (ref 5–15)
BUN: 9 mg/dL (ref 6–23)
CO2: 27 meq/L (ref 19–32)
Calcium: 9.4 mg/dL (ref 8.4–10.5)
Chloride: 99 mEq/L (ref 96–112)
Creatinine, Ser: 0.86 mg/dL (ref 0.50–1.35)
GFR calc Af Amer: >90 mL/min (ref >90)
Glucose, Bld: 101 mg/dL — ABNORMAL HIGH (ref 70–99)
POTASSIUM: 3 meq/L — AB (ref 3.7–5.3)
Sodium: 140 mEq/L (ref 137–147)
Total Bilirubin: 0.6 mg/dL (ref 0.3–1.2)
Total Protein: 7.8 g/dL (ref 6.0–8.3)

## 2014-05-10 MED ORDER — METRONIDAZOLE 500 MG PO TABS: 500.0000 mg | ORAL_TABLET | Freq: Three times a day (TID) | ORAL | Status: DC

## 2014-05-10 MED ORDER — ONDANSETRON 4 MG PO TBDP
4.0000 mg | ORAL_TABLET | Freq: Once | ORAL | Status: AC
  Administered 2014-05-10: 4 mg via ORAL

## 2014-05-10 MED ORDER — ONDANSETRON 4 MG PO TBDP
ORAL_TABLET | ORAL | Status: AC
  Filled 2014-05-10: qty 1

## 2014-05-10 MED ORDER — POTASSIUM CHLORIDE CRYS ER 20 MEQ PO TBCR: 20.0000 meq | EXTENDED_RELEASE_TABLET | Freq: Two times a day (BID) | ORAL | Status: DC

## 2014-05-10 MED ORDER — ONDANSETRON 4 MG PO TBDP: 4.0000 mg | ORAL_TABLET | Freq: Three times a day (TID) | ORAL | Status: DC | PRN

## 2014-05-10 NOTE — Discharge Instructions (Signed)
You likely have a gut infection from bacterial imbalance.  There is concern for a C. Diff. Infection adn will check for this Please start the flagyl for a possible infection Please consider starting a probiotic or start taking a live active culture yogurt Please go to the emergency room if you worsen Please use the zofran for nausea

## 2014-05-10 NOTE — ED Notes (Signed)
C/o abd pain onset x2 weeks; seen here on 11/16 Given Augmentin Sx today include v/d Denies fevers, chills Alert, no signs of acute distress.

## 2014-05-10 NOTE — ED Notes (Signed)
CBC and CMET results reviewed K 3.0 noted Called pt to inform of results Pt w/ Kdur 20mEq on medication list but not taking. Refill sent to pharmacy and pt will start taking.   Shelly Flattenavid Rasheida Broden, MD Family Medicine 05/10/2014, 8:25 PM    Ozella Rocksavid J Jaicee Michelotti, MD 05/10/14 2025
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
## 2014-05-14 ENCOUNTER — Telehealth (HOSPITAL_COMMUNITY): Payer: Self-pay | Admitting: *Deleted

## 2014-05-14 LAB — STOOL CULTURE

## 2014-05-14 NOTE — ED Notes (Signed)
Stool culture neg.  Dr. Konrad DoloresMerrell said to call for clinical improvement since the C. Diff did not get done.  I called pt. Pt. verified x 2 and given result.  He said he was getting better with Zofran and has not started the Metronidazole.  Dr. Konrad DoloresMerrell notified and talked to pt. He told him if it flares back up again to come back and give a stool sample and he will put the order in.  Dr. Konrad DoloresMerrell said he does not need to take the Metronidazole now since he is getting better and to continue with probiotic, yogurt and zofran. Vassie MoselleYork, Tom Macpherson M 05/14/2014

## 2014-05-17 ENCOUNTER — Ambulatory Visit: Payer: 59 | Admitting: Neurology

## 2014-08-26 ENCOUNTER — Other Ambulatory Visit: Payer: Self-pay | Admitting: Internal Medicine

## 2014-11-13 ENCOUNTER — Encounter: Payer: Self-pay | Admitting: Internal Medicine

## 2014-11-29 ENCOUNTER — Encounter (HOSPITAL_COMMUNITY): Payer: Self-pay | Admitting: Emergency Medicine

## 2014-11-29 ENCOUNTER — Emergency Department (INDEPENDENT_AMBULATORY_CARE_PROVIDER_SITE_OTHER)
Admission: EM | Admit: 2014-11-29 | Discharge: 2014-11-29 | Disposition: A | Discharge status: Home / Self Care | Payer: Worker's Compensation | Source: Home / Self Care

## 2014-11-29 DIAGNOSIS — M545 Low back pain, unspecified: Secondary | ICD-10-CM

## 2014-11-29 LAB — POCT URINALYSIS DIP (DEVICE)
BILIRUBIN URINE: NEGATIVE
GLUCOSE, UA: NEGATIVE mg/dL
Hgb urine dipstick: NEGATIVE
Ketones, ur: NEGATIVE mg/dL
LEUKOCYTES UA: NEGATIVE
Nitrite: NEGATIVE
Protein, ur: NEGATIVE mg/dL
Specific Gravity, Urine: 1.02 (ref 1.005–1.030)
Urobilinogen, UA: 0.2 mg/dL (ref 0.0–1.0)
pH: 6.5 (ref 5.0–8.0)

## 2014-11-29 LAB — POCT I-STAT, CHEM 8
BUN: 7 mg/dL (ref 6–20)
Calcium, Ion: 1.22 mmol/L (ref 1.12–1.23)
Chloride: 98 mmol/L — ABNORMAL LOW (ref 101–111)
Creatinine, Ser: 1.1 mg/dL (ref 0.61–1.24)
Glucose, Bld: 92 mg/dL (ref 65–99)
HCT: 46 % (ref 39.0–52.0)
HEMOGLOBIN: 15.6 g/dL (ref 13.0–17.0)
Potassium: 3 mmol/L — ABNORMAL LOW (ref 3.5–5.1)
Sodium: 140 mmol/L (ref 135–145)
TCO2: 30 mmol/L (ref 0–100)

## 2014-11-29 MED ORDER — TRAMADOL HCL 50 MG PO TABS: 50.0000 mg | ORAL_TABLET | Freq: Four times a day (QID) | ORAL | Status: DC | PRN

## 2014-11-29 MED ORDER — CYCLOBENZAPRINE HCL 10 MG PO TABS: 10.0000 mg | ORAL_TABLET | Freq: Two times a day (BID) | ORAL | Status: DC | PRN

## 2014-11-29 NOTE — ED Notes (Signed)
C/o back pain onset 6/8... Reports he inj his back while at work States he was moving a box on a conveyer that weighed roughly 300 lbs ++ Seen by company doctor... Treated for lumbar strain... Given Robaxin, prednisone and ibup w/no relief Slow, steady gait Alert, no acute distress.
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
## 2014-11-29 NOTE — Discharge Instructions (Signed)
Stop using the excessive amounts of IBUPROFEN immediately.  Back Pain, Adult Low back pain is very common. About 1 in 5 people have back pain.The cause of low back pain is rarely dangerous. The pain often gets better over time.About half of people with a sudden onset of back pain feel better in just 2 weeks. About 8 in 10 people feel better by 6 weeks.  CAUSES Some common causes of back pain include:  Strain of the muscles or ligaments supporting the spine.  Wear and tear (degeneration) of the spinal discs.  Arthritis.  Direct injury to the back. DIAGNOSIS Most of the time, the direct cause of low back pain is not known.However, back pain can be treated effectively even when the exact cause of the pain is unknown.Answering your caregiver's questions about your overall health and symptoms is one of the most accurate ways to make sure the cause of your pain is not dangerous. If your caregiver needs more information, he or she may order lab work or imaging tests (X-rays or MRIs).However, even if imaging tests show changes in your back, this usually does not require surgery. HOME CARE INSTRUCTIONS For many people, back pain returns.Since low back pain is rarely dangerous, it is often a condition that people can learn to [facility] their own.   Remain active. It is stressful on the back to sit or stand in one place. Do not sit, drive, or stand in one place for more than 30 minutes at a time. Take short walks on level surfaces as soon as pain allows.Try to increase the length of time you walk each day.  Do not stay in bed.Resting more than 1 or 2 days can delay your recovery.  Do not avoid exercise or work.Your body is made to move.It is not dangerous to be active, even though your back may hurt.Your back will likely heal faster if you return to being active before your pain is gone.  Pay attention to your body when you bend and lift. Many people have less discomfortwhen lifting if  they bend their knees, keep the load close to their bodies,and avoid twisting. Often, the most comfortable positions are those that put less stress on your recovering back.  Find a comfortable position to sleep. Use a firm mattress and lie on your side with your knees slightly bent. If you lie on your back, put a pillow under your knees.  Only take over-the-counter or prescription medicines as directed by your caregiver. Over-the-counter medicines to reduce pain and inflammation are often the most helpful.Your caregiver may prescribe muscle relaxant drugs.These medicines help dull your pain so you can more quickly return to your normal activities and healthy exercise.  Put ice on the injured area.  Put ice in a plastic bag.  Place a towel between your skin and the bag.  Leave the ice on for 15-20 minutes, 03-04 times a day for the first 2 to 3 days. After that, ice and heat may be alternated to reduce pain and spasms.  Ask your caregiver about trying back exercises and gentle massage. This may be of some benefit.  Avoid feeling anxious or stressed.Stress increases muscle tension and can worsen back pain.It is important to recognize when you are anxious or stressed and learn ways to manage it.Exercise is a great option. SEEK MEDICAL CARE IF:  You have pain that is not relieved with rest or medicine.  You have pain that does not improve in 1 week.  You have new  symptoms.  You are generally not feeling well. SEEK IMMEDIATE MEDICAL CARE IF:   You have pain that radiates from your back into your legs.  You develop new bowel or bladder control problems.  You have unusual weakness or numbness in your arms or legs.  You develop nausea or vomiting.  You develop abdominal pain.  You feel faint. Document Released: 05/17/2005 Document Revised: 11/16/2011 Document Reviewed: 09/18/2013 [facility] Patient Information 2015 [place], [place]. This information is not intended to replace  advice given to you by your health care provider. Make sure you discuss any questions you have with your health care provider.

## 2014-12-09 ENCOUNTER — Other Ambulatory Visit: Payer: Self-pay | Admitting: Occupational Medicine

## 2014-12-09 ENCOUNTER — Ambulatory Visit: Payer: Self-pay

## 2014-12-09 ENCOUNTER — Encounter: Payer: Self-pay | Admitting: Internal Medicine

## 2014-12-09 DIAGNOSIS — R52 Pain, unspecified: Secondary | ICD-10-CM

## 2014-12-16 ENCOUNTER — Encounter: Payer: Self-pay | Admitting: Internal Medicine

## 2014-12-16 ENCOUNTER — Other Ambulatory Visit (INDEPENDENT_AMBULATORY_CARE_PROVIDER_SITE_OTHER): Payer: 59

## 2014-12-16 ENCOUNTER — Ambulatory Visit (INDEPENDENT_AMBULATORY_CARE_PROVIDER_SITE_OTHER): Payer: 59 | Admitting: Internal Medicine

## 2014-12-16 VITALS — BP 136/84 | HR 84 | Temp 98.3°F | Resp 16 | Ht 71.0 in | Wt 282.0 lb

## 2014-12-16 DIAGNOSIS — I10 Essential (primary) hypertension: Secondary | ICD-10-CM

## 2014-12-16 DIAGNOSIS — K219 Gastro-esophageal reflux disease without esophagitis: Secondary | ICD-10-CM | POA: Diagnosis not present

## 2014-12-16 DIAGNOSIS — E876 Hypokalemia: Secondary | ICD-10-CM

## 2014-12-16 DIAGNOSIS — Z0000 Encounter for general adult medical examination without abnormal findings: Secondary | ICD-10-CM | POA: Diagnosis not present

## 2014-12-16 DIAGNOSIS — R7989 Other specified abnormal findings of blood chemistry: Secondary | ICD-10-CM | POA: Diagnosis not present

## 2014-12-16 DIAGNOSIS — R739 Hyperglycemia, unspecified: Secondary | ICD-10-CM

## 2014-12-16 DIAGNOSIS — G43019 Migraine without aura, intractable, without status migrainosus: Secondary | ICD-10-CM

## 2014-12-16 LAB — TSH: TSH: 2.76 u[IU]/mL (ref 0.35–4.50)

## 2014-12-16 LAB — CBC WITH DIFFERENTIAL/PLATELET
BASOS PCT: 0.7 % (ref 0.0–3.0)
Basophils Absolute: 0.1 10*3/uL (ref 0.0–0.1)
Eosinophils Absolute: 0.2 10*3/uL (ref 0.0–0.7)
Eosinophils Relative: 2.4 % (ref 0.0–5.0)
HCT: 42.5 % (ref 39.0–52.0)
Hemoglobin: 14.4 g/dL (ref 13.0–17.0)
LYMPHS PCT: 32 % (ref 12.0–46.0)
Lymphs Abs: 3 10*3/uL (ref 0.7–4.0)
MCHC: 33.9 g/dL (ref 30.0–36.0)
MCV: 83.2 fl (ref 78.0–100.0)
MONO ABS: 0.8 10*3/uL (ref 0.1–1.0)
Monocytes Relative: 8.1 % (ref 3.0–12.0)
NEUTROS PCT: 56.8 % (ref 43.0–77.0)
Neutro Abs: 5.3 10*3/uL (ref 1.4–7.7)
Platelets: 389 10*3/uL (ref 150.0–400.0)
RBC: 5.11 Mil/uL (ref 4.22–5.81)
RDW: 13.5 % (ref 11.5–15.5)
WBC: 9.4 10*3/uL (ref 4.0–10.5)

## 2014-12-16 LAB — LIPID PANEL
Cholesterol: 206 mg/dL — ABNORMAL HIGH (ref 0–200)
HDL: 30.2 mg/dL — AB (ref >39.00)
Total CHOL/HDL Ratio: 7
Triglycerides: 462 mg/dL — ABNORMAL HIGH (ref 0.0–149.0)

## 2014-12-16 LAB — LDL CHOLESTEROL, DIRECT: LDL DIRECT: 116 mg/dL

## 2014-12-16 LAB — COMPREHENSIVE METABOLIC PANEL
ALT: 29 U/L (ref 0–53)
AST: 24 U/L (ref 0–37)
Albumin: 4.3 g/dL (ref 3.5–5.2)
Alkaline Phosphatase: 61 U/L (ref 39–117)
BUN: 11 mg/dL (ref 6–23)
CO2: 31 mEq/L (ref 19–32)
Calcium: 9.7 mg/dL (ref 8.4–10.5)
Chloride: 101 mEq/L (ref 96–112)
Creatinine, Ser: 0.91 mg/dL (ref 0.40–1.50)
GFR: 115.78 mL/min (ref >60.00)
Glucose, Bld: 92 mg/dL (ref 70–99)
POTASSIUM: 3.2 meq/L — AB (ref 3.5–5.1)
Sodium: 141 mEq/L (ref 135–145)
TOTAL PROTEIN: 7.3 g/dL (ref 6.0–8.3)
Total Bilirubin: 0.5 mg/dL (ref 0.2–1.2)

## 2014-12-16 LAB — FECAL OCCULT BLOOD, GUAIAC: Fecal Occult Blood: NEGATIVE

## 2014-12-16 LAB — HEMOGLOBIN A1C: HEMOGLOBIN A1C: 5.9 % (ref 4.6–6.5)

## 2014-12-16 LAB — PSA: PSA: 0.79 ng/mL (ref 0.10–4.00)

## 2014-12-16 MED ORDER — NEBIVOLOL HCL 10 MG PO TABS: 10.0000 mg | ORAL_TABLET | Freq: Every day | ORAL | Status: DC

## 2014-12-16 MED ORDER — POTASSIUM CHLORIDE CRYS ER 20 MEQ PO TBCR: 20.0000 meq | EXTENDED_RELEASE_TABLET | Freq: Two times a day (BID) | ORAL | Status: DC

## 2014-12-16 MED ORDER — DEXLANSOPRAZOLE 60 MG PO CPDR: 60.0000 mg | DELAYED_RELEASE_CAPSULE | Freq: Every day | ORAL | Status: DC

## 2014-12-16 MED ORDER — SUMATRIPTAN SUCCINATE 50 MG PO TABS: 50.0000 mg | ORAL_TABLET | ORAL | Status: DC | PRN

## 2014-12-16 NOTE — Progress Notes (Signed)
Pre visit review using our clinic review tool, if applicable. No additional management support is needed unless otherwise documented below in the visit note. 

## 2014-12-16 NOTE — Progress Notes (Signed)
Subjective:  Patient ID: [name] [name], male    DOB: 08-12-1970  Age: 44 y.o. MRN: [id number]  CC: Annual Exam and Hypertension   HPI [name] [name] presents for a complete physical and blood pressure check. He recently had an on-the-job injury and is complaining of low back pain. He tells me this is being evaluated by his employee health. He offers no other complaints. He tells me his headaches are well-controlled with sumatriptan.  Outpatient Prescriptions Prior to Visit  Medication Sig Dispense Refill  . Ascorbic Acid (VITAMIN C) 1000 MG tablet Take 1,000 mg by mouth daily.    Marland Kitchen b complex vitamins capsule Take 1 capsule by mouth daily.    . Biotin 1000 MCG tablet Take 1,000 mcg by mouth 3 (three) times daily.    . cholecalciferol (VITAMIN D) 1000 UNITS tablet Take 1,000 Units by mouth daily.    . fish oil-omega-3 fatty acids 1000 MG capsule Take 1 g by mouth daily.    . SUMAtriptan (IMITREX) 6 MG/0.5ML SOLN injection Inject 0.5 mLs (6 mg total) into the skin as needed for migraine or headache. May repeat in 2 hours if headache persists or recurs. 6 vial 5  . topiramate (TOPAMAX) 50 MG tablet Take 1 tablet (50 mg total) by mouth 2 (two) times daily. 60 tablet 12  . cyclobenzaprine (FLEXERIL) 10 MG tablet Take 1 tablet (10 mg total) by mouth 2 (two) times daily as needed for muscle spasms. 20 tablet 0  . dexlansoprazole (DEXILANT) 60 MG capsule Take 1 capsule (60 mg total) by mouth daily. 30 capsule 5  . nebivolol (BYSTOLIC) 10 MG tablet Take 1 tablet (10 mg total) by mouth daily. 90 tablet 3  . potassium chloride SA (K-DUR,KLOR-CON) 20 MEQ tablet Take 1 tablet (20 mEq total) by mouth 2 (two) times daily. 60 tablet 3  . traMADol (ULTRAM) 50 MG tablet Take 1 tablet (50 mg total) by mouth every 6 (six) hours as needed. 15 tablet 0  . amoxicillin-clavulanate (AUGMENTIN) 875-125 MG per tablet Take 1 tablet by mouth 2 (two) times daily. (Patient not taking: Reported on 12/16/2014) 20 tablet  0  . Azilsartan-Chlorthalidone (EDARBYCLOR) 40-12.5 MG TABS Take 1 tablet by mouth daily. (Patient not taking: Reported on 12/16/2014) 30 tablet 11  . botulinum toxin Type A (BOTOX) 100 UNITS SOLR injection Inject 200 Units into the muscle every 3 (three) months. (Patient not taking: Reported on 12/16/2014) 200 Units 3  . cetirizine (ZYRTEC) 10 MG tablet TAKE 1 TABLET (10 MG TOTAL) BY MOUTH DAILY. (Patient not taking: Reported on 12/16/2014) 30 tablet 11  . DYMISTA 137-50 MCG/ACT SUSP USE 1 SPRAY IN THE NOSE 2 TIMES DAILY (Patient not taking: Reported on 12/16/2014) 23 g 11  . ibuprofen (ADVIL,MOTRIN) 800 MG tablet Take 1 tablet (800 mg total) by mouth every 8 (eight) hours as needed. (Patient not taking: Reported on 12/16/2014) 90 tablet 3  . ipratropium (ATROVENT) 0.06 % nasal spray Place 2 sprays into both nostrils 4 (four) times daily. (Patient not taking: Reported on 12/16/2014) 15 mL 0  . metroNIDAZOLE (FLAGYL) 500 MG tablet Take 1 tablet (500 mg total) by mouth 3 (three) times daily. (Patient not taking: Reported on 12/16/2014) 30 tablet 0  . ondansetron (ZOFRAN-ODT) 4 MG disintegrating tablet Take 1 tablet (4 mg total) by mouth every 8 (eight) hours as needed for nausea or vomiting. (Patient not taking: Reported on 12/16/2014) 20 tablet 0  . SUMAVEL DOSEPRO 6 MG/0.5ML SOTJ Inject 0.5 mLs (  6 mg total) into the skin as directed. Inject into thigh at onset of Migraine.  May repeat x 1 in 24 hours prn (Patient not taking: Reported on 12/16/2014) 3 Syringe 0   No facility-administered medications prior to visit.    ROS Review of Systems  Constitutional: Negative.  Negative for fever, chills, diaphoresis, appetite change and fatigue.  HENT: Negative.   Eyes: Negative.   Respiratory: Negative.  Negative for cough, choking, chest tightness, shortness of breath and stridor.   Cardiovascular: Negative.  Negative for chest pain, palpitations and leg swelling.  Gastrointestinal: Negative.  Negative for  nausea, vomiting, abdominal pain, diarrhea, constipation and blood in stool.  Endocrine: Negative.   Genitourinary: Negative.  Negative for difficulty urinating.  Musculoskeletal: Positive for back pain. Negative for myalgias, joint swelling, arthralgias, gait problem and neck pain.  Skin: Negative.   Allergic/Immunologic: Negative.   Neurological: Negative.  Negative for dizziness, syncope, speech difficulty, weakness, light-headedness, numbness and headaches.  Hematological: Negative.  Negative for adenopathy. Does not bruise/bleed easily.  Psychiatric/Behavioral: Negative.     Objective:  BP 136/84 mmHg  Pulse 84  Temp(Src) 98.3 F (36.8 C) (Oral)  Resp 16  Ht 5\' 11"  (1.803 m)  Wt 282 lb (127.914 kg)  BMI 39.35 kg/m2  SpO2 97%  BP Readings from Last 3 Encounters:  12/16/14 136/84  11/29/14 163/68  05/10/14 129/78    Wt Readings from Last 3 Encounters:  12/16/14 282 lb (127.914 kg)  04/24/14 280 lb (127.007 kg)  03/06/14 277 lb (125.646 kg)    Physical Exam  Constitutional: He is oriented to person, place, and time. He appears well-developed and well-nourished. No distress.  HENT:  Head: Normocephalic and atraumatic.  Mouth/Throat: Oropharynx is clear and moist. No oropharyngeal exudate.  Eyes: Conjunctivae are normal. Right eye exhibits no discharge. Left eye exhibits no discharge. No scleral icterus.  Neck: Normal range of motion. Neck supple. No JVD present. No tracheal deviation present. No thyromegaly present.  Cardiovascular: Normal rate, regular rhythm, normal heart sounds and intact distal pulses.  Exam reveals no gallop and no friction rub.   No murmur heard. Pulmonary/Chest: Effort normal and breath sounds normal. No stridor. No respiratory distress. He has no wheezes. He has no rales. He exhibits no tenderness.  Abdominal: Soft. Bowel sounds are normal. He exhibits no distension and no mass. There is no tenderness. There is no rebound and no guarding. Hernia  confirmed negative in the right inguinal area and confirmed negative in the left inguinal area.  Genitourinary: Rectum normal, prostate normal, testes normal and penis normal. Rectal exam shows no external hemorrhoid, no internal hemorrhoid, no fissure, no mass, no tenderness and anal tone normal. Guaiac negative stool. Prostate is not enlarged and not tender. Right testis shows no mass, no swelling and no tenderness. Right testis is descended. Left testis shows no mass, no swelling and no tenderness. Left testis is descended. Circumcised. No penile erythema or penile tenderness. No discharge found.  Musculoskeletal: Normal range of motion. He exhibits no edema or tenderness.  Lymphadenopathy:    He has no cervical adenopathy.       Right: No inguinal adenopathy present.       Left: No inguinal adenopathy present.  Neurological: He is oriented to person, place, and time.  Skin: Skin is warm and dry. No rash noted. He is not diaphoretic. No erythema. No pallor.  Psychiatric: He has a normal mood and affect. His behavior is normal. Judgment and thought content normal.  Vitals reviewed.   Lab Results  Component Value Date   WBC 9.4 12/16/2014   HGB 14.4 12/16/2014   HCT 42.5 12/16/2014   PLT 389.0 12/16/2014   GLUCOSE 92 12/16/2014   CHOL 206* 12/16/2014   TRIG 462.0* 12/16/2014   HDL 30.20* 12/16/2014   LDLDIRECT 116.0 12/16/2014   LDLCALC 131* 10/12/2013   ALT 29 12/16/2014   AST 24 12/16/2014   NA 141 12/16/2014   K 3.2* 12/16/2014   CL 101 12/16/2014   CREATININE 0.91 12/16/2014   BUN 11 12/16/2014   CO2 31 12/16/2014   TSH 2.76 12/16/2014   PSA 0.79 12/16/2014   INR 0.91 12/21/2012   HGBA1C 5.9 12/16/2014    No results found.  Assessment & Plan:   Holbert was seen today for annual exam and hypertension.  Diagnoses and all orders for this visit:  Essential hypertension, benign- his blood pressure is well controlled. Orders: -     nebivolol (BYSTOLIC) 10 MG tablet;  Take 1 tablet (10 mg total) by mouth daily. -     potassium chloride SA (K-DUR,KLOR-CON) 20 MEQ tablet; Take 1 tablet (20 mEq total) by mouth 2 (two) times daily.  Hypokalemia- I will replete his potassium level with K-lor Orders: -     potassium chloride SA (K-DUR,KLOR-CON) 20 MEQ tablet; Take 1 tablet (20 mEq total) by mouth 2 (two) times daily.  Intractable migraine without aura and without status migrainosus Orders: -     SUMAtriptan (IMITREX) 50 MG tablet; Take 1 tablet (50 mg total) by mouth every 2 (two) hours as needed for migraine. May repeat in 2 hours if headache persists or recurs.  Gastroesophageal reflux disease without esophagitis Orders: -     dexlansoprazole (DEXILANT) 60 MG capsule; Take 1 capsule (60 mg total) by mouth daily.  Routine general medical examination at a health care facility- exam done, vaccines reviewed and updated, labs ordered and reviewed, he was given patient education material. Orders: -     Lipid panel; Future -     Comprehensive metabolic panel; Future -     CBC with Differential/Platelet; Future -     TSH; Future -     PSA; Future  Hyperglycemia- he has prediabetes and agrees to work on his last modifications. Orders: -     Hemoglobin A1c; Future   I have discontinued Mr. Corkum Azilsartan-Chlorthalidone, SUMAVEL DOSEPRO, ibuprofen, botulinum toxin Type A, amoxicillin-clavulanate, ipratropium, metroNIDAZOLE, ondansetron, DYMISTA, cetirizine, traMADol, and cyclobenzaprine. I am also having him start on SUMAtriptan. Additionally, I am having him maintain his Biotin, fish oil-omega-3 fatty acids, b complex vitamins, cholecalciferol, vitamin C, topiramate, SUMAtriptan, nebivolol, potassium chloride SA, and dexlansoprazole.  Meds ordered this encounter  Medications  . nebivolol (BYSTOLIC) 10 MG tablet    Sig: Take 1 tablet (10 mg total) by mouth daily.    Dispense:  90 tablet    Refill:  3  . potassium chloride SA (K-DUR,KLOR-CON) 20 MEQ  tablet    Sig: Take 1 tablet (20 mEq total) by mouth 2 (two) times daily.    Dispense:  180 tablet    Refill:  3  . dexlansoprazole (DEXILANT) 60 MG capsule    Sig: Take 1 capsule (60 mg total) by mouth daily.    Dispense:  90 capsule    Refill:  3  . SUMAtriptan (IMITREX) 50 MG tablet    Sig: Take 1 tablet (50 mg total) by mouth every 2 (two) hours as needed for migraine. May repeat in  2 hours if headache persists or recurs.    Dispense:  10 tablet    Refill:  3     Follow-up: Return in about 4 months (around 04/18/2015).  Sanda Lingerhomas Jones, MD

## 2014-12-16 NOTE — Patient Instructions (Signed)

## 2015-07-11 ENCOUNTER — Emergency Department (HOSPITAL_COMMUNITY): Payer: No Typology Code available for payment source

## 2015-07-11 ENCOUNTER — Emergency Department (HOSPITAL_COMMUNITY)
Admission: EM | Admit: 2015-07-11 | Discharge: 2015-07-12 | Disposition: A | Discharge status: Home / Self Care | Payer: No Typology Code available for payment source | Attending: Emergency Medicine | Admitting: Emergency Medicine

## 2015-07-11 ENCOUNTER — Encounter (HOSPITAL_COMMUNITY): Payer: Self-pay | Admitting: Emergency Medicine

## 2015-07-11 DIAGNOSIS — R269 Unspecified abnormalities of gait and mobility: Secondary | ICD-10-CM | POA: Diagnosis not present

## 2015-07-11 DIAGNOSIS — Z8719 Personal history of other diseases of the digestive system: Secondary | ICD-10-CM | POA: Insufficient documentation

## 2015-07-11 DIAGNOSIS — G8929 Other chronic pain: Secondary | ICD-10-CM | POA: Insufficient documentation

## 2015-07-11 DIAGNOSIS — Z87891 Personal history of nicotine dependence: Secondary | ICD-10-CM | POA: Diagnosis not present

## 2015-07-11 DIAGNOSIS — R0981 Nasal congestion: Secondary | ICD-10-CM | POA: Diagnosis present

## 2015-07-11 DIAGNOSIS — Z79899 Other long term (current) drug therapy: Secondary | ICD-10-CM | POA: Diagnosis not present

## 2015-07-11 DIAGNOSIS — J111 Influenza due to unidentified influenza virus with other respiratory manifestations: Secondary | ICD-10-CM | POA: Insufficient documentation

## 2015-07-11 DIAGNOSIS — R69 Illness, unspecified: Secondary | ICD-10-CM

## 2015-07-11 DIAGNOSIS — I10 Essential (primary) hypertension: Secondary | ICD-10-CM | POA: Insufficient documentation

## 2015-07-11 LAB — COMPREHENSIVE METABOLIC PANEL
ALBUMIN: 3.9 g/dL (ref 3.5–5.0)
ALT: 49 U/L (ref 17–63)
ANION GAP: 15 (ref 5–15)
AST: 42 U/L — ABNORMAL HIGH (ref 15–41)
Alkaline Phosphatase: 62 U/L (ref 38–126)
BILIRUBIN TOTAL: 0.7 mg/dL (ref 0.3–1.2)
BUN: <5 mg/dL — ABNORMAL LOW (ref 6–20)
CO2: 28 mmol/L (ref 22–32)
Calcium: 9.1 mg/dL (ref 8.9–10.3)
Chloride: 97 mmol/L — ABNORMAL LOW (ref 101–111)
Creatinine, Ser: 1.11 mg/dL (ref 0.61–1.24)
GFR calc non Af Amer: >60 mL/min (ref >60)
GLUCOSE: 109 mg/dL — AB (ref 65–99)
POTASSIUM: 2.7 mmol/L — AB (ref 3.5–5.1)
SODIUM: 140 mmol/L (ref 135–145)
TOTAL PROTEIN: 8.1 g/dL (ref 6.5–8.1)

## 2015-07-11 LAB — CBC
HCT: 41.2 % (ref 39.0–52.0)
Hemoglobin: 14.6 g/dL (ref 13.0–17.0)
MCH: 28.1 pg (ref 26.0–34.0)
MCHC: 35.4 g/dL (ref 30.0–36.0)
MCV: 79.4 fL (ref 78.0–100.0)
PLATELETS: 329 10*3/uL (ref 150–400)
RBC: 5.19 MIL/uL (ref 4.22–5.81)
RDW: 13.9 % (ref 11.5–15.5)
WBC: 6.6 10*3/uL (ref 4.0–10.5)

## 2015-07-11 LAB — URINALYSIS, ROUTINE W REFLEX MICROSCOPIC
Bilirubin Urine: NEGATIVE
GLUCOSE, UA: NEGATIVE mg/dL
Hgb urine dipstick: NEGATIVE
Ketones, ur: NEGATIVE mg/dL
LEUKOCYTES UA: NEGATIVE
NITRITE: NEGATIVE
PROTEIN: 30 mg/dL — AB
Specific Gravity, Urine: 1.024 (ref 1.005–1.030)
pH: 6 (ref 5.0–8.0)

## 2015-07-11 LAB — URINE MICROSCOPIC-ADD ON
RBC / HPF: NONE SEEN RBC/hpf (ref 0–5)
SQUAMOUS EPITHELIAL / LPF: NONE SEEN
WBC UA: NONE SEEN WBC/hpf (ref 0–5)

## 2015-07-11 LAB — I-STAT CG4 LACTIC ACID, ED: Lactic Acid, Venous: 3.21 mmol/L (ref 0.5–2.0)

## 2015-07-11 MED ORDER — POTASSIUM CHLORIDE CRYS ER 20 MEQ PO TBCR
40.0000 meq | EXTENDED_RELEASE_TABLET | Freq: Once | ORAL | Status: AC
  Administered 2015-07-11: 40 meq via ORAL
  Filled 2015-07-11: qty 2

## 2015-07-11 MED ORDER — ACETAMINOPHEN 500 MG PO TABS
1000.0000 mg | ORAL_TABLET | Freq: Once | ORAL | Status: AC
  Administered 2015-07-11: 1000 mg via ORAL
  Filled 2015-07-11: qty 2

## 2015-07-11 MED ORDER — ALBUTEROL SULFATE (2.5 MG/3ML) 0.083% IN NEBU
5.0000 mg | INHALATION_SOLUTION | Freq: Once | RESPIRATORY_TRACT | Status: AC
  Administered 2015-07-11: 5 mg via RESPIRATORY_TRACT
  Filled 2015-07-11: qty 6

## 2015-07-11 MED ORDER — ACETAMINOPHEN 325 MG PO TABS
650.0000 mg | ORAL_TABLET | Freq: Once | ORAL | Status: AC | PRN
  Administered 2015-07-11: 650 mg via ORAL

## 2015-07-11 MED ORDER — ACETAMINOPHEN 325 MG PO TABS
ORAL_TABLET | ORAL | Status: AC
  Filled 2015-07-11: qty 1

## 2015-07-11 NOTE — ED Notes (Signed)
Patient here with complaint of congestion, cough, general malaise, and shortness of breath. States onset of nasal congestion was "a while back" (Patient unable to quantify). States that symptoms had resolved some and then returned worse about 2 days ago. Reports fever at home measuring about 101.
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
## 2015-07-12 MED ORDER — AEROCHAMBER PLUS W/MASK MISC
1.0000 | Freq: Once | Status: AC
  Administered 2015-07-12: 1
  Filled 2015-07-12: qty 1

## 2015-07-12 MED ORDER — ALBUTEROL SULFATE HFA 108 (90 BASE) MCG/ACT IN AERS
2.0000 | INHALATION_SPRAY | RESPIRATORY_TRACT | Status: DC | PRN
  Administered 2015-07-12: 2 via RESPIRATORY_TRACT
  Filled 2015-07-12: qty 6.7

## 2015-07-12 MED ORDER — POTASSIUM CHLORIDE CRYS ER 20 MEQ PO TBCR: 20.0000 meq | EXTENDED_RELEASE_TABLET | Freq: Every day | ORAL | Status: DC

## 2015-07-12 NOTE — Discharge Instructions (Signed)
Influenza, Adult Make sure that you drink at least six8 ounce glasses of water each day. Take the potassium prescribed tonight as directed.. Your blood potassium was low today at 2.7. Use your inhaler 2 puffs every 4 hours as needed for cough and shortness of breath. Return if needed more than every 4 hours or see Dr. Yetta Barre. Ask Dr. Yetta Barre to repeat check your blood potassium level in 1 or 2 weeks. Take Tylenol every 4 hours for aches for temperature higher than 100.4. Return if you feel worse for any reason Influenza ("the flu") is a viral infection of the respiratory tract. It occurs more often in winter months because people spend more time in close contact with one another. Influenza can make you feel very sick. Influenza easily spreads from person to person (contagious). CAUSES  Influenza is caused by a virus that infects the respiratory tract. You can catch the virus by breathing in droplets from an infected person's cough or sneeze. You can also catch the virus by touching something that was recently contaminated with the virus and then touching your mouth, nose, or eyes. RISKS AND COMPLICATIONS You may be at risk for a more severe case of influenza if you smoke cigarettes, have diabetes, have chronic heart disease (such as heart failure) or lung disease (such as asthma), or if you have a weakened immune system. Elderly people and pregnant women are also at risk for more serious infections. The most common problem of influenza is a lung infection (pneumonia). Sometimes, this problem can require emergency medical care and may be life threatening. SIGNS AND SYMPTOMS  Symptoms typically last 4 to 10 days and may include:  Fever.  Chills.  Headache, body aches, and muscle aches.  Sore throat.  Chest discomfort and cough.  Poor appetite.  Weakness or feeling tired.  Dizziness.  Nausea or vomiting. DIAGNOSIS  Diagnosis of influenza is often made based on your history and a physical exam.  A nose or throat swab test can be done to confirm the diagnosis. TREATMENT  In mild cases, influenza goes away on its own. Treatment is directed at relieving symptoms. For more severe cases, your health care provider may prescribe antiviral medicines to shorten the sickness. Antibiotic medicines are not effective because the infection is caused by a virus, not by bacteria. HOME CARE INSTRUCTIONS  Take medicines only as directed by your health care provider.  Use a cool mist humidifier to make breathing easier.  Get plenty of rest until your temperature returns to normal. This usually takes 3 to 4 days.  Drink enough fluid to keep your urine clear or pale yellow.  Cover yourmouth and nosewhen coughing or sneezing,and wash your handswellto prevent thevirusfrom spreading.  Stay homefromwork orschool untilthe fever is gonefor at least 56full day. PREVENTION  An annual influenza vaccination (flu shot) is the best way to avoid getting influenza. An annual flu shot is now routinely recommended for all adults in the [place] SEEK MEDICAL CARE IF:  You experiencechest pain, yourcough worsens,or you producemore mucus.  Youhave nausea,vomiting, ordiarrhea.  Your fever returns or gets worse. SEEK IMMEDIATE MEDICAL CARE IF:  You havetrouble breathing, you become short of breath,or your skin ornails becomebluish.  You have severe painor stiffnessin the neck.  You develop a sudden headache, or pain in the face or ear.  You have nausea or vomiting that you cannot control. MAKE SURE YOU:   Understand these instructions.  Will watch your condition.  Will get help right away if  you are not doing well or get worse.   This information is not intended to replace advice given to you by your health care provider. Make sure you discuss any questions you have with your health care provider.   Document Released: 05/14/2000 Document Revised: 06/07/2014 Document Reviewed:  08/16/2011 Elsevier Interactive Patient Education [employer]

## 2015-07-13 LAB — URINE CULTURE: Culture: 1000

## 2015-07-15 ENCOUNTER — Other Ambulatory Visit (INDEPENDENT_AMBULATORY_CARE_PROVIDER_SITE_OTHER): Payer: No Typology Code available for payment source

## 2015-07-15 ENCOUNTER — Encounter: Payer: Self-pay | Admitting: Internal Medicine

## 2015-07-15 ENCOUNTER — Ambulatory Visit (INDEPENDENT_AMBULATORY_CARE_PROVIDER_SITE_OTHER): Payer: No Typology Code available for payment source | Admitting: Internal Medicine

## 2015-07-15 VITALS — BP 140/90 | HR 83 | Temp 98.9°F | Ht 71.0 in | Wt 283.0 lb

## 2015-07-15 DIAGNOSIS — R9431 Abnormal electrocardiogram [ECG] [EKG]: Secondary | ICD-10-CM | POA: Diagnosis not present

## 2015-07-15 DIAGNOSIS — R0609 Other forms of dyspnea: Secondary | ICD-10-CM

## 2015-07-15 DIAGNOSIS — Z23 Encounter for immunization: Secondary | ICD-10-CM

## 2015-07-15 DIAGNOSIS — I10 Essential (primary) hypertension: Secondary | ICD-10-CM

## 2015-07-15 DIAGNOSIS — E876 Hypokalemia: Secondary | ICD-10-CM | POA: Diagnosis not present

## 2015-07-15 DIAGNOSIS — E781 Pure hyperglyceridemia: Secondary | ICD-10-CM

## 2015-07-15 DIAGNOSIS — R0789 Other chest pain: Secondary | ICD-10-CM

## 2015-07-15 LAB — BASIC METABOLIC PANEL
BUN: 8 mg/dL (ref 6–23)
CALCIUM: 9.6 mg/dL (ref 8.4–10.5)
CO2: 30 meq/L (ref 19–32)
CREATININE: 0.92 mg/dL (ref 0.40–1.50)
Chloride: 100 mEq/L (ref 96–112)
GFR: 114.04 mL/min (ref >60.00)
GLUCOSE: 100 mg/dL — AB (ref 70–99)
Potassium: 3.1 mEq/L — ABNORMAL LOW (ref 3.5–5.1)
SODIUM: 142 meq/L (ref 135–145)

## 2015-07-15 LAB — BRAIN NATRIURETIC PEPTIDE: Pro B Natriuretic peptide (BNP): 16 pg/mL (ref 0.0–100.0)

## 2015-07-15 LAB — CARDIAC PANEL
CK-MB: 2.9 ng/mL (ref 0.3–4.0)
Relative Index: 0.7 calc (ref 0.0–2.5)
Total CK: 405 U/L — ABNORMAL HIGH (ref 7–232)

## 2015-07-15 LAB — MAGNESIUM: MAGNESIUM: 2 mg/dL (ref 1.5–2.5)

## 2015-07-15 LAB — TROPONIN I: TNIDX: 0.01 ug/l (ref 0.00–0.06)

## 2015-07-15 MED ORDER — POTASSIUM CHLORIDE CRYS ER 20 MEQ PO TBCR: 20.0000 meq | EXTENDED_RELEASE_TABLET | Freq: Every day | ORAL | Status: DC

## 2015-07-15 MED ORDER — NEBIVOLOL HCL 10 MG PO TABS: 10.0000 mg | ORAL_TABLET | Freq: Every day | ORAL | Status: DC

## 2015-07-15 MED ORDER — OMEGA-3-ACID ETHYL ESTERS 1 G PO CAPS: 2.0000 g | ORAL_CAPSULE | Freq: Two times a day (BID) | ORAL | Status: AC

## 2015-07-15 NOTE — Patient Instructions (Signed)
Nonspecific Chest Pain  °Chest pain can be caused by many different conditions. There is always a chance that your pain could be related to something serious, such as a heart attack or a blood clot in your lungs. Chest pain can also be caused by conditions that are not life-threatening. If you have chest pain, it is very important to follow up with your health care provider. °CAUSES  °Chest pain can be caused by: °· Heartburn. °· Pneumonia or bronchitis. °· Anxiety or stress. °· Inflammation around your heart (pericarditis) or lung (pleuritis or pleurisy). °· A blood clot in your lung. °· A collapsed lung (pneumothorax). It can develop suddenly on its own (spontaneous pneumothorax) or from trauma to the chest. °· Shingles infection (varicella-zoster virus). °· Heart attack. °· Damage to the bones, muscles, and cartilage that make up your chest wall. This can include: °¨ Bruised bones due to injury. °¨ Strained muscles or cartilage due to frequent or repeated coughing or overwork. °¨ Fracture to one or more ribs. °¨ Sore cartilage due to inflammation (costochondritis). °RISK FACTORS  °Risk factors for chest pain may include: °· Activities that increase your risk for trauma or injury to your chest. °· Respiratory infections or conditions that cause frequent coughing. °· Medical conditions or overeating that can cause heartburn. °· Heart disease or family history of heart disease. °· Conditions or health behaviors that increase your risk of developing a blood clot. °· Having had chicken pox (varicella zoster). °SIGNS AND SYMPTOMS °Chest pain can feel like: °· Burning or tingling on the surface of your chest or deep in your chest. °· Crushing, pressure, aching, or squeezing pain. °· Dull or sharp pain that is worse when you move, cough, or take a deep breath. °· Pain that is also felt in your back, neck, shoulder, or arm, or pain that spreads to any of these areas. °Your chest pain may come and go, or it may stay  constant. °DIAGNOSIS °Lab tests or other studies may be needed to find the cause of your pain. Your health care provider may have you take a test called an ambulatory ECG (electrocardiogram). An ECG records your heartbeat patterns at the time the test is performed. You may also have other tests, such as: °· Transthoracic echocardiogram (TTE). During echocardiography, sound waves are used to create a picture of all of the heart structures and to look at how blood flows through your heart. °· Transesophageal echocardiogram (TEE). This is a more advanced imaging test that obtains images from inside your body. It allows your health care provider to see your heart in finer detail. °· Cardiac monitoring. This allows your health care provider to monitor your heart rate and rhythm in real time. °· Holter monitor. This is a portable device that records your heartbeat and can help to diagnose abnormal heartbeats. It allows your health care provider to track your heart activity for several days, if needed. °· Stress tests. These can be done through exercise or by taking medicine that makes your heart beat more quickly. °· Blood tests. °· Imaging tests. °TREATMENT  °Your treatment depends on what is causing your chest pain. Treatment may include: °· Medicines. These may include: °¨ Acid blockers for heartburn. °¨ Anti-inflammatory medicine. °¨ Pain medicine for inflammatory conditions. °¨ Antibiotic medicine, if an infection is present. °¨ Medicines to dissolve blood clots. °¨ Medicines to treat coronary artery disease. °· Supportive care for conditions that do not require medicines. This may include: °¨ Resting. °¨ Applying heat   or cold packs to injured areas. °¨ Limiting activities until pain decreases. °HOME CARE INSTRUCTIONS °· If you were prescribed an antibiotic medicine, finish it all even if you start to feel better. °· Avoid any activities that bring on chest pain. °· Do not use any tobacco products, including  cigarettes, chewing tobacco, or electronic cigarettes. If you need help quitting, ask your health care provider. °· Do not drink alcohol. °· Take medicines only as directed by your health care provider. °· Keep all follow-up visits as directed by your health care provider. This is important. This includes any further testing if your chest pain does not go away. °· If heartburn is the cause for your chest pain, you may be told to keep your head raised (elevated) while sleeping. This reduces the chance that acid will go from your stomach into your esophagus. °· Make lifestyle changes as directed by your health care provider. These may include: °¨ Getting regular exercise. Ask your health care provider to suggest some activities that are safe for you. °¨ Eating a heart-healthy diet. A registered dietitian can help you to learn healthy eating options. °¨ Maintaining a healthy weight. °¨ Managing diabetes, if necessary. °¨ Reducing stress. °SEEK MEDICAL CARE IF: °· Your chest pain does not go away after treatment. °· You have a rash with blisters on your chest. °· You have a fever. °SEEK IMMEDIATE MEDICAL CARE IF:  °· Your chest pain is worse. °· You have an increasing cough, or you cough up blood. °· You have severe abdominal pain. °· You have severe weakness. °· You faint. °· You have chills. °· You have sudden, unexplained chest discomfort. °· You have sudden, unexplained discomfort in your arms, back, neck, or jaw. °· You have shortness of breath at any time. °· You suddenly start to sweat, or your skin gets clammy. °· You feel nauseous or you vomit. °· You suddenly feel light-headed or dizzy. °· Your heart begins to beat quickly, or it feels like it is skipping beats. °These symptoms may represent a serious problem that is an emergency. Do not wait to see if the symptoms will go away. Get medical help right away. Call your local emergency services (911 in the [place] Do not drive yourself to the hospital. °  °This  information is not intended to replace advice given to you by your health care provider. Make sure you discuss any questions you have with your health care provider. °  °Document Released: 02/24/2005 Document Revised: 06/07/2014 Document Reviewed: 12/21/2013 °Elsevier Interactive Patient Education ©2016 [employer] ° °

## 2015-07-15 NOTE — Progress Notes (Signed)
Subjective:  Patient ID: [name] [name], male    DOB: 1970-06-24  Age: 45 y.o. MRN: [id number]  CC: Cough and Chest Pain   HPI [name] [name] presents for follow-up after recent visit to the emergency room for an upper respiratory infection. He was seen about a week ago and had a normal chest x-ray. He tells me that his cough and most symptoms are getting better but for the last week or 2 he has noted some chest pressure and dyspnea on exertion. He denies edema, dizziness, lightheadedness, diaphoresis, syncope, or palpitations. He was also found to have a low potassium level down to 2.7. He has been prescribed potassium supplements but did not start taking it until one day prior to this office visit. He struggles with compliance with medications.  Outpatient Prescriptions Prior to Visit  Medication Sig Dispense Refill  . SUMAtriptan (IMITREX) 50 MG tablet Take 1 tablet (50 mg total) by mouth every 2 (two) hours as needed for migraine. May repeat in 2 hours if headache persists or recurs. 10 tablet 3  . nebivolol (BYSTOLIC) 10 MG tablet Take 1 tablet (10 mg total) by mouth daily. 90 tablet 3  . potassium chloride SA (K-DUR,KLOR-CON) 20 MEQ tablet Take 1 tablet (20 mEq total) by mouth daily. 30 tablet 0  . Ascorbic Acid (VITAMIN C) 1000 MG tablet Take 1,000 mg by mouth daily. Reported on 07/15/2015    . b complex vitamins capsule Take 1 capsule by mouth daily. Reported on 07/15/2015    . Biotin 1000 MCG tablet Take 1,000 mcg by mouth 3 (three) times daily. Reported on 07/15/2015    . cholecalciferol (VITAMIN D) 1000 UNITS tablet Take 1,000 Units by mouth daily. Reported on 07/15/2015    . fish oil-omega-3 fatty acids 1000 MG capsule Take 1 g by mouth daily. Reported on 07/15/2015    . dexlansoprazole (DEXILANT) 60 MG capsule Take 1 capsule (60 mg total) by mouth daily. (Patient not taking: Reported on 07/15/2015) 90 capsule 3  . SUMAtriptan (IMITREX) 6 MG/0.5ML SOLN injection Inject 0.5 mLs (6 mg  total) into the skin as needed for migraine or headache. May repeat in 2 hours if headache persists or recurs. (Patient not taking: Reported on 07/15/2015) 6 vial 5  . topiramate (TOPAMAX) 50 MG tablet Take 1 tablet (50 mg total) by mouth 2 (two) times daily. (Patient not taking: Reported on 07/15/2015) 60 tablet 12   No facility-administered medications prior to visit.    ROS Review of Systems  Constitutional: Negative for fever, chills, diaphoresis, appetite change and fatigue.  HENT: Negative.   Eyes: Negative.   Respiratory: Positive for chest tightness and shortness of breath. Negative for apnea, cough, choking, wheezing and stridor.   Cardiovascular: Negative.  Negative for chest pain, palpitations and leg swelling.  Gastrointestinal: Negative.  Negative for nausea, vomiting, abdominal pain, diarrhea, constipation and blood in stool.  Endocrine: Negative.   Genitourinary: Negative.  Negative for frequency and difficulty urinating.  Musculoskeletal: Positive for back pain. Negative for myalgias, joint swelling, arthralgias and neck pain.  Skin: Negative.  Negative for color change and rash.  Allergic/Immunologic: Negative.   Neurological: Positive for weakness. Negative for dizziness, tremors, light-headedness, numbness and headaches.  Hematological: Negative.  Negative for adenopathy. Does not bruise/bleed easily.  Psychiatric/Behavioral: Negative.     Objective:  BP 140/90 mmHg  Pulse 83  Temp(Src) 98.9 F (37.2 C) (Oral)  Ht  (1.803 m)  Wt 283 lb (128.368 kg)  BMI  39.49 kg/m2  SpO2 97%  BP Readings from Last 3 Encounters:  07/15/15 140/90  07/12/15 159/63  12/16/14 136/84    Wt Readings from Last 3 Encounters:  07/15/15 283 lb (128.368 kg)  07/11/15 270 lb (122.471 kg)  12/16/14 282 lb (127.914 kg)    Physical Exam  Constitutional: He is oriented to person, place, and time. He appears well-developed and well-nourished.  Non-toxic appearance. He does not have  a sickly appearance. He does not appear ill. No distress.  HENT:  Mouth/Throat: Oropharynx is clear and moist. No oropharyngeal exudate.  Eyes: Conjunctivae are normal. Right eye exhibits no discharge. Left eye exhibits no discharge. No scleral icterus.  Neck: Normal range of motion. Neck supple. No JVD present. No tracheal deviation present. No thyromegaly present.  Cardiovascular: Normal rate, regular rhythm, normal heart sounds and intact distal pulses.  Exam reveals no gallop and no friction rub.   No murmur heard. EKG -  Sinus  Rhythm  -Nonspecific QRS widening.   -  Nonspecific T-abnormality.   ABNORMAL  Pulmonary/Chest: Effort normal and breath sounds normal. No stridor. No respiratory distress. He has no wheezes. He has no rales. He exhibits no tenderness.  Abdominal: Soft. Bowel sounds are normal. He exhibits no distension and no mass. There is no tenderness. There is no rebound and no guarding.  Musculoskeletal: Normal range of motion. He exhibits no edema or tenderness.  Lymphadenopathy:    He has no cervical adenopathy.  Neurological: He is alert and oriented to person, place, and time. He has normal reflexes. He displays normal reflexes. No cranial nerve deficit. He exhibits normal muscle tone. Coordination normal.  Skin: Skin is warm and dry. No rash noted. He is not diaphoretic. No erythema. No pallor.    Lab Results  Component Value Date   WBC 6.6 07/11/2015   HGB 14.6 07/11/2015   HCT 41.2 07/11/2015   PLT 329 07/11/2015   GLUCOSE 100* 07/15/2015   CHOL 206* 12/16/2014   TRIG 462.0* 12/16/2014   HDL 30.20* 12/16/2014   LDLDIRECT 116.0 12/16/2014   LDLCALC 131* 10/12/2013   ALT 49 07/11/2015   AST 42* 07/11/2015   NA 142 07/15/2015   K 3.1* 07/15/2015   CL 100 07/15/2015   CREATININE 0.92 07/15/2015   BUN 8 07/15/2015   CO2 30 07/15/2015   TSH 2.76 12/16/2014   PSA 0.79 12/16/2014   INR 0.91 12/21/2012   HGBA1C 5.9 12/16/2014    Dg Chest 2  View  07/11/2015  CLINICAL DATA:  Cough and fever with shortness of breath for 2 days EXAM: CHEST  2 VIEW COMPARISON:  April 09, 2013 FINDINGS: Lungs are clear. Heart size and pulmonary vascularity are normal. No adenopathy. No bone lesions. IMPRESSION: No edema or consolidation. Electronically Signed   By: Bretta Bang III M.D.   On: 07/11/2015 20:32    Assessment & Plan:   Jamis was seen today for cough and chest pain.  Diagnoses and all orders for this visit:  Essential hypertension, benign- his blood pressure is moderately well controlled, will continue Bystolic, will also continue to replete his low potassium level. He agrees to be more compliant with the potassium supplement. -     nebivolol (BYSTOLIC) 10 MG tablet; Take 1 tablet (10 mg total) by mouth daily. -     Magnesium; Future -     Basic metabolic panel; Future  Encounter for immunization -     Flu Vaccine QUAD 36+ mos IM  DOE (  dyspnea on exertion)- his BNP is normal so I don't think he has heart failure, his EKG shows nonspecific changes which is more consistent with hypokalemia than any specific cardiac condition, his CPK-MB and troponin levels are normal today. He does have risk factors for coronary artery disease so I have ordered a Lexiscan to screen for ischemia. -     EKG 12-Lead -     Troponin I; Future -     Cardiac panel; Future -     Brain natriuretic peptide; Future -     Myocardial Perfusion Imaging; Future  Chest tightness or pressure- his chest pain is rather typical for ischemia, his EKG does not show any Q waves or acute ST elevation though there are some changes consistent with hypokalemia, I've asked him to undergo a Lexiscan to screen for coronary artery disease. -     Troponin I; Future -     Cardiac panel; Future -     Brain natriuretic peptide; Future -     Myocardial Perfusion Imaging; Future  Hypokalemia- some improvement noted since the last testing of this, it is still low at 3.1 so he  was encouraged to be more compliant with the potassium replacement therapy. -     Magnesium; Future -     Basic metabolic panel; Future  Nonspecific abnormal electrocardiogram (ECG) (EKG)- he is referred for Lexiscan -     Troponin I; Future -     Cardiac panel; Future -     Brain natriuretic peptide; Future -     Myocardial Perfusion Imaging; Future  Pure hyperglyceridemia- I've asked him to restart omega-3 fish oils for this -     Basic metabolic panel; Future  Other orders -     potassium chloride SA (K-DUR,KLOR-CON) 20 MEQ tablet; Take 1 tablet (20 mEq total) by mouth daily.   I have discontinued Mr. Artley topiramate and dexlansoprazole. I am also having him maintain his Biotin, fish oil-omega-3 fatty acids, b complex vitamins, cholecalciferol, vitamin C, SUMAtriptan, nebivolol, potassium chloride SA, traMADol, and methocarbamol.  Meds ordered this encounter  Medications  . nebivolol (BYSTOLIC) 10 MG tablet    Sig: Take 1 tablet (10 mg total) by mouth daily.    Dispense:  90 tablet    Refill:  3  . potassium chloride SA (K-DUR,KLOR-CON) 20 MEQ tablet    Sig: Take 1 tablet (20 mEq total) by mouth daily.    Dispense:  90 tablet    Refill:  2  . traMADol (ULTRAM) 50 MG tablet    Sig: TAKE 1 TABLET TWICE A DAY AS NEEDED FOR PAIN    Refill:  1  . methocarbamol (ROBAXIN) 500 MG tablet    Sig: Take 500 mg by mouth at bedtime.    Refill:  1     Follow-up: Return in about 4 weeks (around 08/12/2015).  Sanda Linger, MD

## 2015-07-15 NOTE — Progress Notes (Signed)
Pre visit review using our clinic review tool, if applicable. No additional management support is needed unless otherwise documented below in the visit note. 

## 2015-07-29 ENCOUNTER — Telehealth: Payer: Self-pay

## 2015-07-29 NOTE — Telephone Encounter (Signed)
Informed pt medical clearance form faxed and ready to be picked up. Scanned copy sent  To be scanned

## 2015-07-30 ENCOUNTER — Telehealth (HOSPITAL_COMMUNITY): Payer: Self-pay | Admitting: *Deleted

## 2015-07-30 ENCOUNTER — Ambulatory Visit (HOSPITAL_COMMUNITY): Payer: Self-pay

## 2015-07-30 NOTE — Telephone Encounter (Signed)
Left message on voicemail in reference to upcoming appointment scheduled for 08/04/15. Phone number given for a call back so details instructions can be given. Keagan Anthis J Harland Aguiniga, RN  

## 2015-07-31 ENCOUNTER — Ambulatory Visit (HOSPITAL_COMMUNITY): Payer: Self-pay

## 2015-07-31 ENCOUNTER — Telehealth (HOSPITAL_COMMUNITY): Payer: Self-pay | Admitting: *Deleted

## 2015-07-31 NOTE — Telephone Encounter (Signed)
Left message on voicemail in reference to upcoming appointment scheduled for 08/04/15. Phone number given for a call back so details instructions can be given. Isis Costanza J Halden Phegley, RN  

## 2015-08-04 ENCOUNTER — Telehealth: Payer: Self-pay | Admitting: Internal Medicine

## 2015-08-04 ENCOUNTER — Ambulatory Visit (HOSPITAL_COMMUNITY): Payer: Self-pay | Attending: Internal Medicine

## 2015-08-04 DIAGNOSIS — I119 Hypertensive heart disease without heart failure: Secondary | ICD-10-CM | POA: Insufficient documentation

## 2015-08-04 DIAGNOSIS — R0609 Other forms of dyspnea: Secondary | ICD-10-CM | POA: Insufficient documentation

## 2015-08-04 DIAGNOSIS — R9431 Abnormal electrocardiogram [ECG] [EKG]: Secondary | ICD-10-CM | POA: Insufficient documentation

## 2015-08-04 DIAGNOSIS — R0789 Other chest pain: Secondary | ICD-10-CM | POA: Insufficient documentation

## 2015-08-04 MED ORDER — TECHNETIUM TC 99M SESTAMIBI GENERIC - CARDIOLITE
32.6000 | Freq: Once | INTRAVENOUS | Status: AC | PRN
  Administered 2015-08-04: 33 via INTRAVENOUS

## 2015-08-04 MED ORDER — REGADENOSON 0.4 MG/5ML IV SOLN
0.4000 mg | Freq: Once | INTRAVENOUS | Status: AC
  Administered 2015-08-04: 0.4 mg via INTRAVENOUS

## 2015-08-04 NOTE — Telephone Encounter (Signed)
[name] [name] from [name] She states he is scheduled for a fce test and she is wanting to know what tests are for Can you please call her at [phone]

## 2015-08-05 ENCOUNTER — Ambulatory Visit (HOSPITAL_COMMUNITY): Payer: Self-pay | Attending: Cardiology

## 2015-08-05 LAB — MYOCARDIAL PERFUSION IMAGING
CHL CUP NUCLEAR SDS: 0
CHL CUP NUCLEAR SRS: 2
CSEPPHR: 117 {beats}/min
LV sys vol: 67 mL
LVDIAVOL: 143 mL
RATE: 0.26
Rest HR: 100 {beats}/min
SSS: 2
TID: 1

## 2015-08-05 MED ORDER — TECHNETIUM TC 99M SESTAMIBI GENERIC - CARDIOLITE
32.6000 | Freq: Once | INTRAVENOUS | Status: AC | PRN
  Administered 2015-08-05: 33 via INTRAVENOUS

## 2015-08-07 ENCOUNTER — Telehealth: Payer: Self-pay | Admitting: Internal Medicine

## 2015-08-07 NOTE — Telephone Encounter (Signed)
Please follow back up with patient on stress test results.  I did inform patient of md response.  But I feel patient is going to have additional questions.

## 2015-08-07 NOTE — Telephone Encounter (Signed)
Pt given stress test results. 

## 2015-08-19 ENCOUNTER — Ambulatory Visit (INDEPENDENT_AMBULATORY_CARE_PROVIDER_SITE_OTHER): Payer: PRIVATE HEALTH INSURANCE | Admitting: Internal Medicine

## 2015-08-19 ENCOUNTER — Encounter: Payer: Self-pay | Admitting: Internal Medicine

## 2015-08-19 VITALS — BP 150/90 | HR 93 | Temp 98.2°F | Resp 16 | Ht 71.0 in | Wt 280.0 lb

## 2015-08-19 DIAGNOSIS — I10 Essential (primary) hypertension: Secondary | ICD-10-CM

## 2015-08-19 DIAGNOSIS — E781 Pure hyperglyceridemia: Secondary | ICD-10-CM | POA: Diagnosis not present

## 2015-08-19 DIAGNOSIS — E876 Hypokalemia: Secondary | ICD-10-CM | POA: Diagnosis not present

## 2015-08-19 MED ORDER — TRIAMTERENE-HCTZ 50-25 MG PO CAPS: 1.0000 | ORAL_CAPSULE | ORAL | Status: DC

## 2015-08-19 NOTE — Patient Instructions (Signed)
Hypertension Hypertension, commonly called high blood pressure, is when the force of blood pumping through your arteries is too strong. Your arteries are the blood vessels that carry blood from your heart throughout your body. A blood pressure reading consists of a higher number over a lower number, such as 110/72. The higher number (systolic) is the pressure inside your arteries when your heart pumps. The lower number (diastolic) is the pressure inside your arteries when your heart relaxes. Ideally you want your blood pressure below 120/80. Hypertension forces your heart to work harder to pump blood. Your arteries may become narrow or stiff. Having untreated or uncontrolled hypertension can cause heart attack, stroke, kidney disease, and other problems. RISK FACTORS Some risk factors for high blood pressure are controllable. Others are not.  Risk factors you cannot control include:   Race. You may be at higher risk if you are African American.  Age. Risk increases with age.  Gender. Men are at higher risk than women before age 45 years. After age 65, women are at higher risk than men. Risk factors you can control include:  Not getting enough exercise or physical activity.  Being overweight.  Getting too much fat, sugar, calories, or salt in your diet.  Drinking too much alcohol. SIGNS AND SYMPTOMS Hypertension does not usually cause signs or symptoms. Extremely high blood pressure (hypertensive crisis) may cause headache, anxiety, shortness of breath, and nosebleed. DIAGNOSIS To check if you have hypertension, your health care provider will measure your blood pressure while you are seated, with your arm held at the level of your heart. It should be measured at least twice using the same arm. Certain conditions can cause a difference in blood pressure between your right and left arms. A blood pressure reading that is higher than normal on one occasion does not mean that you need treatment. If  it is not clear whether you have high blood pressure, you may be asked to return on a different day to have your blood pressure checked again. Or, you may be asked to monitor your blood pressure at home for 1 or more weeks. TREATMENT Treating high blood pressure includes making lifestyle changes and possibly taking medicine. Living a healthy lifestyle can help lower high blood pressure. You may need to change some of your habits. Lifestyle changes may include:  Following the DASH diet. This diet is high in fruits, vegetables, and whole grains. It is low in salt, red meat, and added sugars.  Keep your sodium intake below 2,300 mg per day.  Getting at least 30-45 minutes of aerobic exercise at least 4 times per week.  Losing weight if necessary.  Not smoking.  Limiting alcoholic beverages.  Learning ways to reduce stress. Your health care provider may prescribe medicine if lifestyle changes are not enough to get your blood pressure under control, and if one of the following is true:  You are 18-59 years of age and your systolic blood pressure is above 140.  You are 60 years of age or older, and your systolic blood pressure is above 150.  Your diastolic blood pressure is above 90.  You have diabetes, and your systolic blood pressure is over 140 or your diastolic blood pressure is over 90.  You have kidney disease and your blood pressure is above 140/90.  You have heart disease and your blood pressure is above 140/90. Your personal target blood pressure may vary depending on your medical conditions, your age, and other factors. HOME CARE INSTRUCTIONS    Have your blood pressure rechecked as directed by your health care provider.   Take medicines only as directed by your health care provider. Follow the directions carefully. Blood pressure medicines must be taken as prescribed. The medicine does not work as well when you skip doses. Skipping doses also puts you at risk for  problems.  Do not smoke.   Monitor your blood pressure at home as directed by your health care provider. SEEK MEDICAL CARE IF:   You think you are having a reaction to medicines taken.  You have recurrent headaches or feel dizzy.  You have swelling in your ankles.  You have trouble with your vision. SEEK IMMEDIATE MEDICAL CARE IF:  You develop a severe headache or confusion.  You have unusual weakness, numbness, or feel faint.  You have severe chest or abdominal pain.  You vomit repeatedly.  You have trouble breathing. MAKE SURE YOU:   Understand these instructions.  Will watch your condition.  Will get help right away if you are not doing well or get worse.   This information is not intended to replace advice given to you by your health care provider. Make sure you discuss any questions you have with your health care provider.   Document Released: 05/17/2005 Document Revised: 10/01/2014 Document Reviewed: 03/09/2013 Elsevier Interactive Patient Education 2016 [employer]  

## 2015-08-19 NOTE — Progress Notes (Signed)
Pre visit review using our clinic review tool, if applicable. No additional management support is needed unless otherwise documented below in the visit note. 

## 2015-08-19 NOTE — Progress Notes (Signed)
Subjective:  Patient ID: [name] [name], male    DOB: 1971-03-27  Age: 45 y.o. MRN: [id number]  CC: Hypertension   HPI [name] [name] presents for a blood pressure check. He complains that his blood pressure is not well controlled. It has been difficult to control over the last year. He complains of intermittent headaches but denies chest pain, shortness of breath, dyspnea on exertion, fatigue, blurred vision, or edema. He exercises on a treadmill several times a week. He is taking Bystolic and has no side effects but also feels like it has not been enough to control his blood pressure.  Outpatient Prescriptions Prior to Visit  Medication Sig Dispense Refill  . Ascorbic Acid (VITAMIN C) 1000 MG tablet Take 1,000 mg by mouth daily. Reported on 07/15/2015    . b complex vitamins capsule Take 1 capsule by mouth daily. Reported on 07/15/2015    . Biotin 1000 MCG tablet Take 1,000 mcg by mouth 3 (three) times daily. Reported on 07/15/2015    . cholecalciferol (VITAMIN D) 1000 UNITS tablet Take 1,000 Units by mouth daily. Reported on 07/15/2015    . methocarbamol (ROBAXIN) 500 MG tablet Take 500 mg by mouth at bedtime.  1  . nebivolol (BYSTOLIC) 10 MG tablet Take 1 tablet (10 mg total) by mouth daily. 90 tablet 3  . omega-3 acid ethyl esters (LOVAZA) 1 g capsule Take 2 capsules (2 g total) by mouth 2 (two) times daily. 120 capsule 11  . potassium chloride SA (K-DUR,KLOR-CON) 20 MEQ tablet Take 1 tablet (20 mEq total) by mouth daily. 90 tablet 2  . SUMAtriptan (IMITREX) 50 MG tablet Take 1 tablet (50 mg total) by mouth every 2 (two) hours as needed for migraine. May repeat in 2 hours if headache persists or recurs. 10 tablet 3  . traMADol (ULTRAM) 50 MG tablet TAKE 1 TABLET TWICE A DAY AS NEEDED FOR PAIN  1   No facility-administered medications prior to visit.    ROS Review of Systems  Constitutional: Negative.  Negative for fatigue and unexpected weight change.  Eyes: Negative.  Negative for  visual disturbance.  Respiratory: Negative.  Negative for cough, choking, chest tightness, shortness of breath and stridor.   Cardiovascular: Negative.  Negative for chest pain, palpitations and leg swelling.  Gastrointestinal: Negative.  Negative for nausea, vomiting, abdominal pain, diarrhea, constipation and blood in stool.  Endocrine: Negative.   Genitourinary: Negative.   Musculoskeletal: Positive for back pain. Negative for myalgias, arthralgias and neck pain.  Skin: Negative.   Neurological: Positive for headaches. Negative for dizziness, tremors, seizures, syncope, facial asymmetry, speech difficulty, weakness, light-headedness and numbness.  Hematological: Negative.  Negative for adenopathy. Does not bruise/bleed easily.  Psychiatric/Behavioral: Negative.     Objective:  BP 150/90 mmHg  Pulse 93  Temp(Src) 98.2 F (36.8 C) (Oral)  Resp 16  Ht 5\' 11"  (1.803 m)  Wt 280 lb (127.007 kg)  BMI 39.07 kg/m2  SpO2 95%  BP Readings from Last 3 Encounters:  08/19/15 150/90  07/15/15 140/90  07/12/15 159/63    Wt Readings from Last 3 Encounters:  08/19/15 280 lb (127.007 kg)  08/04/15 283 lb (128.368 kg)  07/15/15 283 lb (128.368 kg)    Physical Exam  Constitutional: He is oriented to person, place, and time. He appears well-developed and well-nourished. No distress.  HENT:  Head: Normocephalic and atraumatic.  Mouth/Throat: Oropharynx is clear and moist. No oropharyngeal exudate.  Eyes: Conjunctivae are normal. Right eye exhibits no discharge.  Left eye exhibits no discharge. No scleral icterus.  Neck: Normal range of motion. Neck supple. No JVD present. No tracheal deviation present. No thyromegaly present.  Cardiovascular: Normal rate, regular rhythm, normal heart sounds and intact distal pulses.  Exam reveals no gallop and no friction rub.   No murmur heard. EKG---  Sinus  Rhythm  -  Nonspecific T-abnormality. Flat T waves in V4 V5 V6, no change from prior  EKG.  ABNORMAL   Pulmonary/Chest: Effort normal and breath sounds normal. No stridor. No respiratory distress. He has no wheezes. He has no rales. He exhibits no tenderness.  Abdominal: Soft. Bowel sounds are normal. He exhibits no distension and no mass. There is no tenderness. There is no rebound and no guarding.  Musculoskeletal: Normal range of motion. He exhibits no edema or tenderness.  Lymphadenopathy:    He has no cervical adenopathy.  Neurological: He is oriented to person, place, and time.  Skin: Skin is warm and dry. No rash noted. He is not diaphoretic. No erythema. No pallor.  Psychiatric: He has a normal mood and affect. His behavior is normal. Judgment and thought content normal.  Vitals reviewed.   Lab Results  Component Value Date   WBC 6.6 07/11/2015   HGB 14.6 07/11/2015   HCT 41.2 07/11/2015   PLT 329 07/11/2015   GLUCOSE 100* 07/15/2015   CHOL 206* 12/16/2014   TRIG 462.0* 12/16/2014   HDL 30.20* 12/16/2014   LDLDIRECT 116.0 12/16/2014   LDLCALC 131* 10/12/2013   ALT 49 07/11/2015   AST 42* 07/11/2015   NA 142 07/15/2015   K 3.1* 07/15/2015   CL 100 07/15/2015   CREATININE 0.92 07/15/2015   BUN 8 07/15/2015   CO2 30 07/15/2015   TSH 2.76 12/16/2014   PSA 0.79 12/16/2014   INR 0.91 12/21/2012   HGBA1C 5.9 12/16/2014    Dg Chest 2 View  07/11/2015  CLINICAL DATA:  Cough and fever with shortness of breath for 2 days EXAM: CHEST  2 VIEW COMPARISON:  April 09, 2013 FINDINGS: Lungs are clear. Heart size and pulmonary vascularity are normal. No adenopathy. No bone lesions. IMPRESSION: No edema or consolidation. Electronically Signed   By: Bretta Bang III M.D.   On: 07/11/2015 20:32    Assessment & Plan:   Jewel was seen today for hypertension.  Diagnoses and all orders for this visit:  Hypokalemia He agrees to be more compliant with his potassium replacement therapy, I will start a potassium sparing diuretic with triamterene -     Magnesium;  Future -     Basic metabolic panel; Future -     triamterene-hydrochlorothiazide (DYAZIDE) 50-25 MG capsule; Take 1 capsule by mouth every morning.  Pure hyperglyceridemia- he is due for recheck on his triglycerides, if they are at or about 500 then will treat accordingly. -     Lipid panel; Future  Essential hypertension, benign- his blood pressure is not well controlled, there is no LVH on his EKG, I've asked him to continue Bystolic but will also add Dyazide for better blood pressure control. -     Magnesium; Future -     Basic metabolic panel; Future -     triamterene-hydrochlorothiazide (DYAZIDE) 50-25 MG capsule; Take 1 capsule by mouth every morning. -     EKG 12-Lead   I am having Mr. Zoll start on triamterene-hydrochlorothiazide. I am also having him maintain his Biotin, b complex vitamins, cholecalciferol, vitamin C, SUMAtriptan, nebivolol, potassium chloride SA, traMADol, methocarbamol,  and omega-3 acid ethyl esters.  Meds ordered this encounter  Medications  . triamterene-hydrochlorothiazide (DYAZIDE) 50-25 MG capsule    Sig: Take 1 capsule by mouth every morning.    Dispense:  90 capsule    Refill:  1     Follow-up: Return in about 3 months (around 11/19/2015).  Sanda Linger, MD

## 2015-08-20 NOTE — Addendum Note (Signed)
Addended by: Radford PaxAIRRIKIER DAVIDSON, Eylin Pontarelli M on: 08/20/2015 11:55 AM   Modules accepted: Kipp BroodSmartSet

## 2015-09-22 ENCOUNTER — Encounter: Payer: Self-pay | Admitting: Family Medicine

## 2015-09-22 ENCOUNTER — Ambulatory Visit (INDEPENDENT_AMBULATORY_CARE_PROVIDER_SITE_OTHER): Payer: PRIVATE HEALTH INSURANCE | Admitting: Family Medicine

## 2015-09-22 VITALS — BP 152/99 | HR 100 | Temp 98.7°F | Resp 18 | Wt 278.0 lb

## 2015-09-22 DIAGNOSIS — J324 Chronic pansinusitis: Secondary | ICD-10-CM | POA: Diagnosis not present

## 2015-09-22 DIAGNOSIS — R05 Cough: Secondary | ICD-10-CM

## 2015-09-22 DIAGNOSIS — R059 Cough, unspecified: Secondary | ICD-10-CM

## 2015-09-22 MED ORDER — ALBUTEROL SULFATE HFA 108 (90 BASE) MCG/ACT IN AERS: 2.0000 | INHALATION_SPRAY | Freq: Four times a day (QID) | RESPIRATORY_TRACT | Status: AC | PRN

## 2015-09-22 MED ORDER — AMOXICILLIN-POT CLAVULANATE 875-125 MG PO TABS: 1.0000 | ORAL_TABLET | Freq: Two times a day (BID) | ORAL | Status: DC

## 2015-09-22 MED ORDER — ALBUTEROL SULFATE (2.5 MG/3ML) 0.083% IN NEBU
3.0000 mL | INHALATION_SOLUTION | Freq: Once | RESPIRATORY_TRACT | Status: AC
  Administered 2015-09-22: 3 mL via RESPIRATORY_TRACT

## 2015-09-22 MED ORDER — PREDNISONE 10 MG PO TABS: ORAL_TABLET | ORAL | Status: DC

## 2015-09-22 NOTE — Progress Notes (Signed)
Pre visit review using our clinic review tool, if applicable. No additional management support is needed unless otherwise documented below in the visit note. 

## 2015-09-22 NOTE — Patient Instructions (Signed)
Please take antibiotic and prednisone as prescribed with food. Also, albuterol inhaler has been provided to you to use on an as needed basis with this episode. Please rest, drink plenty of fluids, and take all regularly prescribed medication as ordered by your PCP.  If symptoms do not improve in 3-4 days with treatments that have been prescribed for you, please follow up with PCP for further evaluation.  Sinusitis, Adult Sinusitis is redness, soreness, and inflammation of the paranasal sinuses. Paranasal sinuses are air pockets within the bones of your face. They are located beneath your eyes, in the middle of your forehead, and above your eyes. In healthy paranasal sinuses, mucus is able to drain out, and air is able to circulate through them by way of your nose. However, when your paranasal sinuses are inflamed, mucus and air can become trapped. This can allow bacteria and other germs to grow and cause infection. Sinusitis can develop quickly and last only a short time (acute) or continue over a long period (chronic). Sinusitis that lasts for more than 12 weeks is considered chronic. CAUSES Causes of sinusitis include:  Allergies.  Structural abnormalities, such as displacement of the cartilage that separates your nostrils (deviated septum), which can decrease the air flow through your nose and sinuses and affect sinus drainage.  Functional abnormalities, such as when the small hairs (cilia) that line your sinuses and help remove mucus do not work properly or are not present. SIGNS AND SYMPTOMS Symptoms of acute and chronic sinusitis are the same. The primary symptoms are pain and pressure around the affected sinuses. Other symptoms include:  Upper toothache.  Earache.  Headache.  Bad breath.  Decreased sense of smell and taste.  A cough, which worsens when you are lying flat.  Fatigue.  Fever.  Thick drainage from your nose, which often is green and may contain pus  (purulent).  Swelling and warmth over the affected sinuses. DIAGNOSIS Your health care provider will perform a physical exam. During your exam, your health care provider may perform any of the following to help determine if you have acute sinusitis or chronic sinusitis:  Look in your nose for signs of abnormal growths in your nostrils (nasal polyps).  Tap over the affected sinus to check for signs of infection.  View the inside of your sinuses using an imaging device that has a light attached (endoscope). If your health care provider suspects that you have chronic sinusitis, one or more of the following tests may be recommended:  Allergy tests.  Nasal culture. A sample of mucus is taken from your nose, sent to a lab, and screened for bacteria.  Nasal cytology. A sample of mucus is taken from your nose and examined by your health care provider to determine if your sinusitis is related to an allergy. TREATMENT Most cases of acute sinusitis are related to a viral infection and will resolve on their own within 10 days. Sometimes, medicines are prescribed to help relieve symptoms of both acute and chronic sinusitis. These may include pain medicines, decongestants, nasal steroid sprays, or saline sprays. However, for sinusitis related to a bacterial infection, your health care provider will prescribe antibiotic medicines. These are medicines that will help kill the bacteria causing the infection. Rarely, sinusitis is caused by a fungal infection. In these cases, your health care provider will prescribe antifungal medicine. For some cases of chronic sinusitis, surgery is needed. Generally, these are cases in which sinusitis recurs more than 3 times per year, despite other  treatments. HOME CARE INSTRUCTIONS  Drink plenty of water. Water helps thin the mucus so your sinuses can drain more easily.  Use a humidifier.  Inhale steam 3-4 times a day (for example, sit in the bathroom with the shower  running).  Apply a warm, moist washcloth to your face 3-4 times a day, or as directed by your health care provider.  Use saline nasal sprays to help moisten and clean your sinuses.  Take medicines only as directed by your health care provider.  If you were prescribed either an antibiotic or antifungal medicine, finish it all even if you start to feel better. SEEK IMMEDIATE MEDICAL CARE IF:  You have increasing pain or severe headaches.  You have nausea, vomiting, or drowsiness.  You have swelling around your face.  You have vision problems.  You have a stiff neck.  You have difficulty breathing.   This information is not intended to replace advice given to you by your health care provider. Make sure you discuss any questions you have with your health care provider.   Document Released: 05/17/2005 Document Revised: 06/07/2014 Document Reviewed: 06/01/2011 Elsevier Interactive Patient Education [employer]2016 Elsevier Inc.

## 2015-09-22 NOTE — Addendum Note (Signed)
Addended by: Griselda MinerJIMENEZ, Brallan Denio E on: 09/22/2015 02:41 PM   Modules accepted: Orders
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
## 2015-09-22 NOTE — Addendum Note (Signed)
Addended by: Roddie McKORDSMEIER, Taquanna Borras A on: 09/22/2015 02:58 PM   Modules accepted: Kipp BroodSmartSet

## 2015-09-26 ENCOUNTER — Telehealth: Payer: Self-pay

## 2015-09-26 DIAGNOSIS — I10 Essential (primary) hypertension: Secondary | ICD-10-CM

## 2015-09-26 NOTE — Telephone Encounter (Signed)
[facility] called and states that this medication is not even being made anymore and they need a new RX triamterene-hydrochlorothiazide (DYAZIDE) 50-25 MG capsule [[id number]][[id number]]   I advised that nothing maybe couldn't be done until Next Wednesday when Dr. Yetta BarreJones came back. Please follow up. Thank you.

## 2015-09-26 NOTE — Telephone Encounter (Signed)
Phaarmacy states may be on backorder they can switch to maxzide same strength in tablets, ok to switch? Informed PCP out office

## 2015-09-27 NOTE — Telephone Encounter (Signed)
Yes, ok to switch  

## 2015-09-29 NOTE — Addendum Note (Signed)
Addended by: Deatra JamesBRAND, LUCY M on: 09/29/2015 10:32 AM   Modules accepted: Orders, Medications

## 2015-09-29 NOTE — Telephone Encounter (Signed)
See below MD ok the switch will send rx in for tablets...Raechel Chute/lmb

## 2015-09-29 NOTE — Telephone Encounter (Signed)
Could not locate in epic so I can [facility] spoke w/pharmacist Shawna OrleansMelanie ok to change to Maxzide. They are needing to know what strength because it does not come in 50/25 mg. Pls advise/LMB

## 2015-09-30 MED ORDER — TRIAMTERENE-HCTZ 37.5-25 MG PO TABS: 1.0000 | ORAL_TABLET | Freq: Every day | ORAL | Status: DC

## 2015-09-30 NOTE — Telephone Encounter (Signed)
changed

## 2015-09-30 NOTE — Telephone Encounter (Signed)
Pharmacy informed.

## 2015-09-30 NOTE — Addendum Note (Signed)
Addended by: Etta GrandchildJONES, Akaash L on: 09/30/2015 04:51 PM   Modules accepted: Orders

## 2015-12-15 ENCOUNTER — Ambulatory Visit: Payer: Self-pay | Admitting: Internal Medicine

## 2015-12-16 ENCOUNTER — Telehealth: Payer: Self-pay | Admitting: Internal Medicine

## 2015-12-16 NOTE — Telephone Encounter (Signed)
Patient no showed for follow up on 7/17.  Please advise.

## 2015-12-16 NOTE — Telephone Encounter (Signed)
Call him to reschedule 

## 2015-12-16 NOTE — Telephone Encounter (Signed)
Called patient.  He thought appointment was for today. We have rescheduled to Tuesday the 25th.  Patient apologizes to Dr. Yetta BarreJones.

## 2015-12-23 ENCOUNTER — Encounter: Payer: Self-pay | Admitting: Internal Medicine

## 2015-12-23 ENCOUNTER — Other Ambulatory Visit (INDEPENDENT_AMBULATORY_CARE_PROVIDER_SITE_OTHER): Payer: PRIVATE HEALTH INSURANCE

## 2015-12-23 ENCOUNTER — Ambulatory Visit (INDEPENDENT_AMBULATORY_CARE_PROVIDER_SITE_OTHER): Payer: PRIVATE HEALTH INSURANCE | Admitting: Internal Medicine

## 2015-12-23 VITALS — BP 144/90 | HR 66 | Temp 98.5°F | Resp 16 | Ht 71.0 in | Wt 279.0 lb

## 2015-12-23 DIAGNOSIS — E781 Pure hyperglyceridemia: Secondary | ICD-10-CM

## 2015-12-23 DIAGNOSIS — I10 Essential (primary) hypertension: Secondary | ICD-10-CM

## 2015-12-23 DIAGNOSIS — E876 Hypokalemia: Secondary | ICD-10-CM

## 2015-12-23 DIAGNOSIS — R739 Hyperglycemia, unspecified: Secondary | ICD-10-CM

## 2015-12-23 DIAGNOSIS — E782 Mixed hyperlipidemia: Secondary | ICD-10-CM

## 2015-12-23 DIAGNOSIS — M5440 Lumbago with sciatica, unspecified side: Secondary | ICD-10-CM

## 2015-12-23 DIAGNOSIS — M5430 Sciatica, unspecified side: Secondary | ICD-10-CM

## 2015-12-23 DIAGNOSIS — M545 Low back pain: Secondary | ICD-10-CM

## 2015-12-23 LAB — CBC WITH DIFFERENTIAL/PLATELET
BASOS ABS: 0 10*3/uL (ref 0.0–0.1)
Basophils Relative: 0.5 % (ref 0.0–3.0)
EOS ABS: 0.1 10*3/uL (ref 0.0–0.7)
Eosinophils Relative: 1.3 % (ref 0.0–5.0)
HCT: 42.1 % (ref 39.0–52.0)
Hemoglobin: 14.5 g/dL (ref 13.0–17.0)
LYMPHS ABS: 3 10*3/uL (ref 0.7–4.0)
Lymphocytes Relative: 29.6 % (ref 12.0–46.0)
MCHC: 34.3 g/dL (ref 30.0–36.0)
MCV: 81.4 fl (ref 78.0–100.0)
MONOS PCT: 9.1 % (ref 3.0–12.0)
Monocytes Absolute: 0.9 10*3/uL (ref 0.1–1.0)
Neutro Abs: 5.9 10*3/uL (ref 1.4–7.7)
Neutrophils Relative %: 59.5 % (ref 43.0–77.0)
Platelets: 408 10*3/uL — ABNORMAL HIGH (ref 150.0–400.0)
RBC: 5.18 Mil/uL (ref 4.22–5.81)
RDW: 14.4 % (ref 11.5–15.5)
WBC: 10 10*3/uL (ref 4.0–10.5)

## 2015-12-23 LAB — TSH: TSH: 1.43 u[IU]/mL (ref 0.35–4.50)

## 2015-12-23 LAB — COMPREHENSIVE METABOLIC PANEL
ALBUMIN: 4.6 g/dL (ref 3.5–5.2)
ALK PHOS: 55 U/L (ref 39–117)
ALT: 28 U/L (ref 0–53)
AST: 19 U/L (ref 0–37)
BILIRUBIN TOTAL: 0.6 mg/dL (ref 0.2–1.2)
BUN: 11 mg/dL (ref 6–23)
CALCIUM: 10.1 mg/dL (ref 8.4–10.5)
CO2: 32 meq/L (ref 19–32)
CREATININE: 1.08 mg/dL (ref 0.40–1.50)
Chloride: 102 mEq/L (ref 96–112)
GFR: 94.59 mL/min (ref >60.00)
Glucose, Bld: 98 mg/dL (ref 70–99)
Potassium: 3.4 mEq/L — ABNORMAL LOW (ref 3.5–5.1)
Sodium: 141 mEq/L (ref 135–145)
Total Protein: 8.1 g/dL (ref 6.0–8.3)

## 2015-12-23 LAB — MAGNESIUM: MAGNESIUM: 2.2 mg/dL (ref 1.5–2.5)

## 2015-12-23 LAB — LIPID PANEL
CHOL/HDL RATIO: 6
Cholesterol: 193 mg/dL (ref 0–200)
HDL: 30.4 mg/dL — ABNORMAL LOW (ref >39.00)
NONHDL: 162.29
Triglycerides: 299 mg/dL — ABNORMAL HIGH (ref 0.0–149.0)
VLDL: 59.8 mg/dL — ABNORMAL HIGH (ref 0.0–40.0)

## 2015-12-23 LAB — HEMOGLOBIN A1C: HEMOGLOBIN A1C: 6 % (ref 4.6–6.5)

## 2015-12-23 LAB — LDL CHOLESTEROL, DIRECT: LDL DIRECT: 124 mg/dL

## 2015-12-23 MED ORDER — CARVEDILOL 3.125 MG PO TABS: 3.1250 mg | ORAL_TABLET | Freq: Two times a day (BID) | ORAL | 1 refills | Status: DC

## 2015-12-23 MED ORDER — POTASSIUM CHLORIDE CRYS ER 20 MEQ PO TBCR: 20.0000 meq | EXTENDED_RELEASE_TABLET | Freq: Every day | ORAL | 1 refills | Status: AC

## 2015-12-23 NOTE — Progress Notes (Signed)
Pre visit review using our clinic review tool, if applicable. No additional management support is needed unless otherwise documented below in the visit note. 

## 2015-12-23 NOTE — Progress Notes (Signed)
Subjective:  Patient ID: [name] [name], male    DOB: June 27, 1970  Age: 45 y.o. MRN: [id number]  CC: Hypertension; Hyperlipidemia; Osteoarthritis; and Back Pain   HPI [name] [name] presents for a blood pressure check and follow-up on the above medical problems. He complains that his blood pressure has not been very well controlled because he is not taking nebivolol as he thinks it was too expensive. He has had no episodes of headache/blurred vision/chest pain/shortness of breath/palpitations/edema/fatigue.  He wants to be referred to pain management for evaluation of his chronic low back pain. He is currently getting symptom relief with tramadol.  Outpatient Medications Prior to Visit  Medication Sig Dispense Refill  . albuterol (PROVENTIL HFA;VENTOLIN HFA) 108 (90 Base) MCG/ACT inhaler Inhale 2 puffs into the lungs every 6 (six) hours as needed for wheezing or shortness of breath. 1 Inhaler 0  . Ascorbic Acid (VITAMIN C) 1000 MG tablet Take 1,000 mg by mouth daily. Reported on 07/15/2015    . b complex vitamins capsule Take 1 capsule by mouth daily. Reported on 07/15/2015    . Biotin 1000 MCG tablet Take 1,000 mcg by mouth 3 (three) times daily. Reported on 07/15/2015    . cholecalciferol (VITAMIN D) 1000 UNITS tablet Take 1,000 Units by mouth daily. Reported on 07/15/2015    . omega-3 acid ethyl esters (LOVAZA) 1 g capsule Take 2 capsules (2 g total) by mouth 2 (two) times daily. 120 capsule 11  . SUMAtriptan (IMITREX) 50 MG tablet Take 1 tablet (50 mg total) by mouth every 2 (two) hours as needed for migraine. May repeat in 2 hours if headache persists or recurs. 10 tablet 3  . traMADol (ULTRAM) 50 MG tablet TAKE 1 TABLET TWICE A DAY AS NEEDED FOR PAIN  1  . triamterene-hydrochlorothiazide (MAXZIDE-25) 37.5-25 MG tablet Take 1 tablet by mouth daily. 90 tablet 1  . amoxicillin-clavulanate (AUGMENTIN) 875-125 MG tablet Take 1 tablet by mouth 2 (two) times daily. 20 tablet 0  . methocarbamol  (ROBAXIN) 500 MG tablet Take 500 mg by mouth at bedtime.  1  . nebivolol (BYSTOLIC) 10 MG tablet Take 1 tablet (10 mg total) by mouth daily. 90 tablet 3  . potassium chloride SA (K-DUR,KLOR-CON) 20 MEQ tablet Take 1 tablet (20 mEq total) by mouth daily. 90 tablet 2  . predniSONE (DELTASONE) 10 MG tablet Take 4 tablets by mouth once daily for 2 days, 3 tablets once daily for 2 days, 2 tablets once daily for 2 days, and 1 tablet once daily for 2 days. 20 tablet 0   No facility-administered medications prior to visit.     ROS Review of Systems  Constitutional: Negative.  Negative for activity change, diaphoresis and fatigue.  HENT: Negative.  Negative for trouble swallowing and voice change.   Eyes: Negative.  Negative for visual disturbance.  Respiratory: Negative for cough, choking, chest tightness, shortness of breath and stridor.   Cardiovascular: Negative.  Negative for chest pain, palpitations and leg swelling.  Gastrointestinal: Negative.  Negative for abdominal pain, constipation, diarrhea, nausea and vomiting.  Endocrine: Negative.   Genitourinary: Negative.  Negative for dysuria and frequency.  Musculoskeletal: Positive for arthralgias and back pain. Negative for gait problem, joint swelling, myalgias and neck pain.  Skin: Negative.  Negative for color change and rash.  Allergic/Immunologic: Negative.   Neurological: Negative.  Negative for dizziness, facial asymmetry, weakness, light-headedness, numbness and headaches.  Hematological: Negative.  Negative for adenopathy. Does not bruise/bleed easily.  Psychiatric/Behavioral: Negative.  Objective:  BP (!) 144/90 (BP Location: Left Arm, Patient Position: Sitting, Cuff Size: Large)   Pulse 66   Temp 98.5 F (36.9 C) (Oral)   Resp 16   Ht 5\' 11"  (1.803 m)   Wt 279 lb (126.6 kg)   BMI 38.91 kg/m   BP Readings from Last 3 Encounters:  12/23/15 (!) 144/90  09/22/15 (!) 152/99  08/19/15 (!) 150/90    Wt Readings from Last  3 Encounters:  12/23/15 279 lb (126.6 kg)  09/22/15 278 lb (126.1 kg)  08/19/15 280 lb (127 kg)    Physical Exam  Constitutional: He is oriented to person, place, and time. No distress.  HENT:  Mouth/Throat: Oropharynx is clear and moist. No oropharyngeal exudate.  Eyes: Conjunctivae are normal. Right eye exhibits no discharge. Left eye exhibits no discharge. No scleral icterus.  Neck: Normal range of motion. Neck supple. No JVD present. No tracheal deviation present. No thyromegaly present.  Cardiovascular: Normal rate, regular rhythm, normal heart sounds and intact distal pulses.  Exam reveals no gallop and no friction rub.   No murmur heard. Pulmonary/Chest: Effort normal and breath sounds normal. No stridor. No respiratory distress. He has no wheezes. He has no rales. He exhibits no tenderness.  Abdominal: Soft. Bowel sounds are normal. He exhibits no distension and no mass. There is no tenderness. There is no rebound and no guarding.  Musculoskeletal: Normal range of motion. He exhibits no edema or deformity.  Lymphadenopathy:    He has no cervical adenopathy.  Neurological: He is oriented to person, place, and time.  Skin: Skin is warm and dry. No rash noted. He is not diaphoretic. No erythema. No pallor.  Vitals reviewed.   Lab Results  Component Value Date   WBC 10.0 12/23/2015   HGB 14.5 12/23/2015   HCT 42.1 12/23/2015   PLT 408.0 (H) 12/23/2015   GLUCOSE 98 12/23/2015   CHOL 193 12/23/2015   TRIG 299.0 (H) 12/23/2015   HDL 30.40 (L) 12/23/2015   LDLDIRECT 124.0 12/23/2015   LDLCALC 131 (H) 10/12/2013   ALT 28 12/23/2015   AST 19 12/23/2015   NA 141 12/23/2015   K 3.4 (L) 12/23/2015   CL 102 12/23/2015   CREATININE 1.08 12/23/2015   BUN 11 12/23/2015   CO2 32 12/23/2015   TSH 1.43 12/23/2015   PSA 0.79 12/16/2014   INR 0.91 12/21/2012   HGBA1C 6.0 12/23/2015    Dg Chest 2 View  Result Date: 07/11/2015 CLINICAL DATA:  Cough and fever with shortness of  breath for 2 days EXAM: CHEST  2 VIEW COMPARISON:  April 09, 2013 FINDINGS: Lungs are clear. Heart size and pulmonary vascularity are normal. No adenopathy. No bone lesions. IMPRESSION: No edema or consolidation. Electronically Signed   By: Bretta Bang III M.D.   On: 07/11/2015 20:32    Assessment & Plan:   Guerin was seen today for hypertension, hyperlipidemia, osteoarthritis and back pain.  Diagnoses and all orders for this visit:  Essential hypertension, benign- his blood pressure is not adequately well controlled, will change nebivolol to carvedilol to lower the cause, I have asked him to continue taking the thiazide diuretic combination. -     CBC with Differential/Platelet; Future -     TSH; Future -     carvedilol (COREG) 3.125 MG tablet; Take 1 tablet (3.125 mg total) by mouth 2 (two) times daily with a meal.  Hyperglycemia- his A1c is up to 6%, he is prediabetic and agrees to  work on his lifestyle modifications. -     Hemoglobin A1c; Future  Hypokalemia- he has not recently been taking his potassium supplement and his potassium level is still slightly low at 3.4, I've asked him to restart the potassium replacement therapy. -     Magnesium; Future -     Comprehensive metabolic panel; Future -     potassium chloride SA (K-DUR,KLOR-CON) 20 MEQ tablet; Take 1 tablet (20 mEq total) by mouth daily.  Mixed hyperlipidemia- triglycerides remain mildly elevated -     Lipid panel; Future -     TSH; Future  Pure hyperglyceridemia- his triglycerides are still moderately elevated, will continue the omega-3 fish oils and have asked him to improve his lifestyle modifications. -     Lipid panel; Future  Back pain of lumbar region with sciatica- pain management referral as requested. -     Ambulatory referral to Pain Clinic   I have discontinued Mr. Gerrard nebivolol, methocarbamol, amoxicillin-clavulanate, and predniSONE. I am also having him start on carvedilol. Additionally, I  am having him maintain his Biotin, b complex vitamins, cholecalciferol, vitamin C, SUMAtriptan, traMADol, omega-3 acid ethyl esters, albuterol, triamterene-hydrochlorothiazide, and potassium chloride SA.  Meds ordered this encounter  Medications  . carvedilol (COREG) 3.125 MG tablet    Sig: Take 1 tablet (3.125 mg total) by mouth 2 (two) times daily with a meal.    Dispense:  90 tablet    Refill:  1  . potassium chloride SA (K-DUR,KLOR-CON) 20 MEQ tablet    Sig: Take 1 tablet (20 mEq total) by mouth daily.    Dispense:  90 tablet    Refill:  1     Follow-up: Return in about 3 months (around 03/24/2016).  Sanda Linger, MD

## 2015-12-23 NOTE — Patient Instructions (Signed)
Hypertension Hypertension, commonly called high blood pressure, is when the force of blood pumping through your arteries is too strong. Your arteries are the blood vessels that carry blood from your heart throughout your body. A blood pressure reading consists of a higher number over a lower number, such as 110/72. The higher number (systolic) is the pressure inside your arteries when your heart pumps. The lower number (diastolic) is the pressure inside your arteries when your heart relaxes. Ideally you want your blood pressure below 120/80. Hypertension forces your heart to work harder to pump blood. Your arteries may become narrow or stiff. Having untreated or uncontrolled hypertension can cause heart attack, stroke, kidney disease, and other problems. RISK FACTORS Some risk factors for high blood pressure are controllable. Others are not.  Risk factors you cannot control include:   Race. You may be at higher risk if you are African American.  Age. Risk increases with age.  Gender. Men are at higher risk than women before age 45 years. After age 65, women are at higher risk than men. Risk factors you can control include:  Not getting enough exercise or physical activity.  Being overweight.  Getting too much fat, sugar, calories, or salt in your diet.  Drinking too much alcohol. SIGNS AND SYMPTOMS Hypertension does not usually cause signs or symptoms. Extremely high blood pressure (hypertensive crisis) may cause headache, anxiety, shortness of breath, and nosebleed. DIAGNOSIS To check if you have hypertension, your health care provider will measure your blood pressure while you are seated, with your arm held at the level of your heart. It should be measured at least twice using the same arm. Certain conditions can cause a difference in blood pressure between your right and left arms. A blood pressure reading that is higher than normal on one occasion does not mean that you need treatment. If  it is not clear whether you have high blood pressure, you may be asked to return on a different day to have your blood pressure checked again. Or, you may be asked to monitor your blood pressure at home for 1 or more weeks. TREATMENT Treating high blood pressure includes making lifestyle changes and possibly taking medicine. Living a healthy lifestyle can help lower high blood pressure. You may need to change some of your habits. Lifestyle changes may include:  Following the DASH diet. This diet is high in fruits, vegetables, and whole grains. It is low in salt, red meat, and added sugars.  Keep your sodium intake below 2,300 mg per day.  Getting at least 30-45 minutes of aerobic exercise at least 4 times per week.  Losing weight if necessary.  Not smoking.  Limiting alcoholic beverages.  Learning ways to reduce stress. Your health care provider may prescribe medicine if lifestyle changes are not enough to get your blood pressure under control, and if one of the following is true:  You are 18-59 years of age and your systolic blood pressure is above 140.  You are 60 years of age or older, and your systolic blood pressure is above 150.  Your diastolic blood pressure is above 90.  You have diabetes, and your systolic blood pressure is over 140 or your diastolic blood pressure is over 90.  You have kidney disease and your blood pressure is above 140/90.  You have heart disease and your blood pressure is above 140/90. Your personal target blood pressure may vary depending on your medical conditions, your age, and other factors. HOME CARE INSTRUCTIONS    Have your blood pressure rechecked as directed by your health care provider.   Take medicines only as directed by your health care provider. Follow the directions carefully. Blood pressure medicines must be taken as prescribed. The medicine does not work as well when you skip doses. Skipping doses also puts you at risk for  problems.  Do not smoke.   Monitor your blood pressure at home as directed by your health care provider. SEEK MEDICAL CARE IF:   You think you are having a reaction to medicines taken.  You have recurrent headaches or feel dizzy.  You have swelling in your ankles.  You have trouble with your vision. SEEK IMMEDIATE MEDICAL CARE IF:  You develop a severe headache or confusion.  You have unusual weakness, numbness, or feel faint.  You have severe chest or abdominal pain.  You vomit repeatedly.  You have trouble breathing. MAKE SURE YOU:   Understand these instructions.  Will watch your condition.  Will get help right away if you are not doing well or get worse.   This information is not intended to replace advice given to you by your health care provider. Make sure you discuss any questions you have with your health care provider.   Document Released: 05/17/2005 Document Revised: 10/01/2014 Document Reviewed: 03/09/2013 Elsevier Interactive Patient Education 2016 [employer]  

## 2016-02-13 ENCOUNTER — Encounter
Payer: PRIVATE HEALTH INSURANCE | Attending: Physical Medicine & Rehabilitation | Admitting: Physical Medicine & Rehabilitation

## 2016-02-13 ENCOUNTER — Encounter: Payer: Self-pay | Admitting: Physical Medicine & Rehabilitation

## 2016-02-13 VITALS — BP 165/111 | HR 85

## 2016-02-13 DIAGNOSIS — R29818 Other symptoms and signs involving the nervous system: Secondary | ICD-10-CM | POA: Insufficient documentation

## 2016-02-13 DIAGNOSIS — I10 Essential (primary) hypertension: Secondary | ICD-10-CM | POA: Insufficient documentation

## 2016-02-13 DIAGNOSIS — Z87891 Personal history of nicotine dependence: Secondary | ICD-10-CM | POA: Insufficient documentation

## 2016-02-13 DIAGNOSIS — R269 Unspecified abnormalities of gait and mobility: Secondary | ICD-10-CM | POA: Diagnosis not present

## 2016-02-13 DIAGNOSIS — M549 Dorsalgia, unspecified: Secondary | ICD-10-CM

## 2016-02-13 DIAGNOSIS — G479 Sleep disorder, unspecified: Secondary | ICD-10-CM | POA: Diagnosis not present

## 2016-02-13 DIAGNOSIS — G43909 Migraine, unspecified, not intractable, without status migrainosus: Secondary | ICD-10-CM | POA: Insufficient documentation

## 2016-02-13 DIAGNOSIS — G8929 Other chronic pain: Secondary | ICD-10-CM

## 2016-02-13 DIAGNOSIS — M545 Low back pain: Secondary | ICD-10-CM | POA: Insufficient documentation

## 2016-02-13 MED ORDER — DULOXETINE HCL 30 MG PO CPEP: 30.0000 mg | ORAL_CAPSULE | Freq: Every day | ORAL | 1 refills | Status: DC

## 2016-02-13 MED ORDER — DICLOFENAC SODIUM 1 % TD GEL: 2.0000 g | Freq: Four times a day (QID) | TRANSDERMAL | 1 refills | Status: DC

## 2016-02-13 MED ORDER — METHOCARBAMOL 750 MG PO TABS: 750.0000 mg | ORAL_TABLET | Freq: Every evening | ORAL | 1 refills | Status: DC | PRN

## 2016-02-13 MED ORDER — GABAPENTIN 100 MG PO CAPS: 100.0000 mg | ORAL_CAPSULE | Freq: Every day | ORAL | 1 refills | Status: DC
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
## 2016-03-08 ENCOUNTER — Ambulatory Visit: Payer: PRIVATE HEALTH INSURANCE | Admitting: Physical Therapy

## 2016-03-26 ENCOUNTER — Ambulatory Visit: Payer: Self-pay | Admitting: Physical Medicine & Rehabilitation

## 2016-04-01 ENCOUNTER — Encounter (INDEPENDENT_AMBULATORY_CARE_PROVIDER_SITE_OTHER): Payer: Self-pay

## 2016-04-01 ENCOUNTER — Encounter
Payer: PRIVATE HEALTH INSURANCE | Attending: Physical Medicine & Rehabilitation | Admitting: Physical Medicine & Rehabilitation

## 2016-04-01 ENCOUNTER — Encounter: Payer: Self-pay | Admitting: Physical Medicine & Rehabilitation

## 2016-04-01 VITALS — BP 163/108 | HR 85

## 2016-04-01 DIAGNOSIS — M62830 Muscle spasm of back: Secondary | ICD-10-CM

## 2016-04-01 DIAGNOSIS — I10 Essential (primary) hypertension: Secondary | ICD-10-CM | POA: Insufficient documentation

## 2016-04-01 DIAGNOSIS — G479 Sleep disorder, unspecified: Secondary | ICD-10-CM | POA: Diagnosis not present

## 2016-04-01 DIAGNOSIS — M545 Low back pain, unspecified: Secondary | ICD-10-CM

## 2016-04-01 DIAGNOSIS — R269 Unspecified abnormalities of gait and mobility: Secondary | ICD-10-CM | POA: Diagnosis not present

## 2016-04-01 DIAGNOSIS — Z87891 Personal history of nicotine dependence: Secondary | ICD-10-CM | POA: Insufficient documentation

## 2016-04-01 DIAGNOSIS — G8929 Other chronic pain: Secondary | ICD-10-CM | POA: Diagnosis present

## 2016-04-01 DIAGNOSIS — R29818 Other symptoms and signs involving the nervous system: Secondary | ICD-10-CM | POA: Diagnosis not present

## 2016-04-01 DIAGNOSIS — G43909 Migraine, unspecified, not intractable, without status migrainosus: Secondary | ICD-10-CM | POA: Insufficient documentation

## 2016-04-01 MED ORDER — DICLOFENAC SODIUM 1 % TD GEL: 2.0000 g | Freq: Four times a day (QID) | TRANSDERMAL | 1 refills | Status: DC

## 2016-04-01 MED ORDER — DULOXETINE HCL 60 MG PO CPEP: 60.0000 mg | ORAL_CAPSULE | Freq: Every day | ORAL | 1 refills | Status: DC

## 2016-04-01 MED ORDER — BACLOFEN 10 MG PO TABS: 5.0000 mg | ORAL_TABLET | Freq: Three times a day (TID) | ORAL | 1 refills | Status: AC

## 2016-04-01 MED ORDER — GABAPENTIN 300 MG PO CAPS: 300.0000 mg | ORAL_CAPSULE | Freq: Every day | ORAL | 1 refills | Status: DC
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
## 2016-07-01 ENCOUNTER — Ambulatory Visit (INDEPENDENT_AMBULATORY_CARE_PROVIDER_SITE_OTHER)
Admission: RE | Admit: 2016-07-01 | Discharge: 2016-07-01 | Disposition: A | Discharge status: Home / Self Care | Payer: PRIVATE HEALTH INSURANCE | Source: Ambulatory Visit | Attending: Internal Medicine | Admitting: Internal Medicine

## 2016-07-01 ENCOUNTER — Encounter: Payer: Self-pay | Admitting: Internal Medicine

## 2016-07-01 ENCOUNTER — Ambulatory Visit (INDEPENDENT_AMBULATORY_CARE_PROVIDER_SITE_OTHER): Payer: PRIVATE HEALTH INSURANCE | Admitting: Internal Medicine

## 2016-07-01 VITALS — BP 170/100 | HR 80 | Temp 98.4°F | Resp 16 | Ht 71.0 in | Wt 287.8 lb

## 2016-07-01 DIAGNOSIS — B9789 Other viral agents as the cause of diseases classified elsewhere: Secondary | ICD-10-CM

## 2016-07-01 DIAGNOSIS — I10 Essential (primary) hypertension: Secondary | ICD-10-CM | POA: Diagnosis not present

## 2016-07-01 DIAGNOSIS — R05 Cough: Secondary | ICD-10-CM

## 2016-07-01 DIAGNOSIS — R059 Cough, unspecified: Secondary | ICD-10-CM

## 2016-07-01 DIAGNOSIS — E876 Hypokalemia: Secondary | ICD-10-CM

## 2016-07-01 DIAGNOSIS — J4520 Mild intermittent asthma, uncomplicated: Secondary | ICD-10-CM | POA: Diagnosis not present

## 2016-07-01 DIAGNOSIS — M5440 Lumbago with sciatica, unspecified side: Secondary | ICD-10-CM

## 2016-07-01 DIAGNOSIS — Z23 Encounter for immunization: Secondary | ICD-10-CM | POA: Diagnosis not present

## 2016-07-01 DIAGNOSIS — J301 Allergic rhinitis due to pollen: Secondary | ICD-10-CM

## 2016-07-01 DIAGNOSIS — J069 Acute upper respiratory infection, unspecified: Secondary | ICD-10-CM | POA: Insufficient documentation

## 2016-07-01 LAB — POCT EXHALED NITRIC OXIDE: FeNO level (ppb): 28

## 2016-07-01 MED ORDER — NEBIVOLOL HCL 10 MG PO TABS: 10.0000 mg | ORAL_TABLET | Freq: Every day | ORAL | 1 refills | Status: DC

## 2016-07-01 MED ORDER — TRAMADOL HCL 50 MG PO TABS: 50.0000 mg | ORAL_TABLET | Freq: Four times a day (QID) | ORAL | 3 refills | Status: AC | PRN

## 2016-07-01 MED ORDER — HYDROCODONE-HOMATROPINE 5-1.5 MG/5ML PO SYRP: 5.0000 mL | ORAL_SOLUTION | Freq: Three times a day (TID) | ORAL | 0 refills | Status: DC | PRN

## 2016-07-01 MED ORDER — TRIAMTERENE-HCTZ 37.5-25 MG PO TABS: 1.0000 | ORAL_TABLET | Freq: Every day | ORAL | 1 refills | Status: DC

## 2016-07-01 MED ORDER — FLUTICASONE FUROATE-VILANTEROL 100-25 MCG/INH IN AEPB: 1.0000 | INHALATION_SPRAY | Freq: Every day | RESPIRATORY_TRACT | 11 refills | Status: AC

## 2016-07-01 NOTE — Patient Instructions (Signed)
Cough, Adult Coughing is a reflex that clears your throat and your airways. Coughing helps to heal and protect your lungs. It is normal to cough occasionally, but a cough that happens with other symptoms or lasts a long time may be a sign of a condition that needs treatment. A cough may last only 2-3 weeks (acute), or it may last longer than 8 weeks (chronic). What are the causes? Coughing is commonly caused by:  Breathing in substances that irritate your lungs.  A viral or bacterial respiratory infection.  Allergies.  Asthma.  Postnasal drip.  Smoking.  Acid backing up from the stomach into the esophagus (gastroesophageal reflux).  Certain medicines.  Chronic lung problems, including COPD (or rarely, lung cancer).  Other medical conditions such as heart failure.  Follow these instructions at home: Pay attention to any changes in your symptoms. Take these actions to help with your discomfort:  Take medicines only as told by your health care provider. ? If you were prescribed an antibiotic medicine, take it as told by your health care provider. Do not stop taking the antibiotic even if you start to feel better. ? Talk with your health care provider before you take a cough suppressant medicine.  Drink enough fluid to keep your urine clear or pale yellow.  If the air is dry, use a cold steam vaporizer or humidifier in your bedroom or your home to help loosen secretions.  Avoid anything that causes you to cough at work or at home.  If your cough is worse at night, try sleeping in a semi-upright position.  Avoid cigarette smoke. If you smoke, quit smoking. If you need help quitting, ask your health care provider.  Avoid caffeine.  Avoid alcohol.  Rest as needed.  Contact a health care provider if:  You have new symptoms.  You cough up pus.  Your cough does not get better after 2-3 weeks, or your cough gets worse.  You cannot control your cough with suppressant  medicines and you are losing sleep.  You develop pain that is getting worse or pain that is not controlled with pain medicines.  You have a fever.  You have unexplained weight loss.  You have night sweats. Get help right away if:  You cough up blood.  You have difficulty breathing.  Your heartbeat is very fast. This information is not intended to replace advice given to you by your health care provider. Make sure you discuss any questions you have with your health care provider. Document Released: 11/13/2010 Document Revised: 10/23/2015 Document Reviewed: 07/24/2014 Elsevier Interactive Patient Education  2017 [employer]  

## 2016-07-01 NOTE — Progress Notes (Signed)
Pre visit review using our clinic review tool, if applicable. No additional management support is needed unless otherwise documented below in the visit note. 

## 2016-07-01 NOTE — Progress Notes (Signed)
Subjective:  Patient ID: [name] [name], male    DOB: April 02, 1971  Age: 46 y.o. MRN: [id number]  CC: URI; Cough; and Hypertension   HPI [name] [name] presents for a blood pressure check. He ran out of his diuretic about 3 days ago and tells me his blood pressure has not been well controlled. He has been taking carvedilol but forgets to take his second dose during the day and wants to know if he can be switched to a daily medication that is similar. He has had no secondary symptoms of hypertension.  He also complains of a one-week history of runny nose, NP cough, night sweats, low-grade fever, chills, shortness of breath, and fatigue but no wheezing, chest pain, or hemoptysis.  Outpatient Medications Prior to Visit  Medication Sig Dispense Refill  . albuterol (PROVENTIL HFA;VENTOLIN HFA) 108 (90 Base) MCG/ACT inhaler Inhale 2 puffs into the lungs every 6 (six) hours as needed for wheezing or shortness of breath. 1 Inhaler 0  . Ascorbic Acid (VITAMIN C) 1000 MG tablet Take 1,000 mg by mouth daily. Reported on 07/15/2015    . b complex vitamins capsule Take 1 capsule by mouth daily. Reported on 07/15/2015    . cholecalciferol (VITAMIN D) 1000 UNITS tablet Take 1,000 Units by mouth daily. Reported on 07/15/2015    . omega-3 acid ethyl esters (LOVAZA) 1 g capsule Take 2 capsules (2 g total) by mouth 2 (two) times daily. 120 capsule 11  . potassium chloride SA (K-DUR,KLOR-CON) 20 MEQ tablet Take 1 tablet (20 mEq total) by mouth daily. 90 tablet 1  . Biotin 1000 MCG tablet Take 1,000 mcg by mouth 3 (three) times daily. Reported on 07/15/2015    . carvedilol (COREG) 3.125 MG tablet Take 1 tablet (3.125 mg total) by mouth 2 (two) times daily with a meal. 90 tablet 1  . triamterene-hydrochlorothiazide (MAXZIDE-25) 37.5-25 MG tablet Take 1 tablet by mouth daily. 90 tablet 1  . diclofenac sodium (VOLTAREN) 1 % GEL Apply 2 g topically 4 (four) times daily. 1 Tube 1  . DULoxetine (CYMBALTA) 60 MG capsule  Take 1 capsule (60 mg total) by mouth daily. 30 capsule 1  . gabapentin (NEURONTIN) 300 MG capsule Take 1 capsule (300 mg total) by mouth at bedtime. 30 capsule 1   No facility-administered medications prior to visit.     ROS Review of Systems  Constitutional: Positive for chills, fatigue and fever. Negative for appetite change, diaphoresis and unexpected weight change.  HENT: Negative.  Negative for congestion, sore throat and trouble swallowing.   Eyes: Negative for visual disturbance.  Respiratory: Positive for cough and shortness of breath. Negative for chest tightness, wheezing and stridor.   Cardiovascular: Negative for chest pain, palpitations and leg swelling.  Gastrointestinal: Negative for abdominal pain, constipation, diarrhea, nausea and vomiting.  Endocrine: Negative.   Genitourinary: Negative.  Negative for difficulty urinating.  Musculoskeletal: Positive for back pain. Negative for myalgias.       He has chronic, intermittent low back pain. He was previously seen by pain management but he felt like they charged him too much so he doesn't want to go back. He does request a refill on tramadol for pain control.  Skin: Negative.  Negative for color change and rash.  Allergic/Immunologic: Negative.   Neurological: Negative.  Negative for dizziness, weakness, light-headedness and headaches.  Hematological: Negative.  Negative for adenopathy. Does not bruise/bleed easily.  Psychiatric/Behavioral: Negative.     Objective:  BP (!) 170/100 (BP Location:  Left Arm, Patient Position: Sitting, Cuff Size: Large)   Pulse 80   Temp 98.4 F (36.9 C) (Oral)   Resp 16   Ht 5\' 11"  (1.803 m)   Wt 287 lb 12 oz (130.5 kg)   SpO2 94%   BMI 40.13 kg/m   BP Readings from Last 3 Encounters:  07/01/16 (!) 170/100  04/01/16 (!) 163/108  02/13/16 (!) 165/111    Wt Readings from Last 3 Encounters:  07/01/16 287 lb 12 oz (130.5 kg)  12/23/15 279 lb (126.6 kg)  09/22/15 278 lb (126.1 kg)      Physical Exam  Constitutional: He is oriented to person, place, and time. No distress.  HENT:  Mouth/Throat: Oropharynx is clear and moist. No oropharyngeal exudate.  Eyes: Right eye exhibits no discharge. Left eye exhibits no discharge. No scleral icterus.  Neck: Normal range of motion. Neck supple. No JVD present. No tracheal deviation present. No thyromegaly present.  Cardiovascular: Normal rate, regular rhythm, normal heart sounds and intact distal pulses.  Exam reveals no gallop and no friction rub.   No murmur heard. Pulmonary/Chest: Effort normal. No accessory muscle usage or stridor. No tachypnea. No respiratory distress. He has no decreased breath sounds. He has no wheezes. He has rhonchi in the right middle field and the left middle field. He has rales. He exhibits no tenderness.  Abdominal: Soft. Bowel sounds are normal. He exhibits no distension and no mass. There is no tenderness. There is no rebound and no guarding.  Musculoskeletal: Normal range of motion. He exhibits no edema, tenderness or deformity.  Lymphadenopathy:    He has no cervical adenopathy.  Neurological: He is oriented to person, place, and time.  Skin: Skin is warm and dry. No rash noted. He is not diaphoretic. No erythema. No pallor.  Vitals reviewed.   Lab Results  Component Value Date   WBC 10.0 12/23/2015   HGB 14.5 12/23/2015   HCT 42.1 12/23/2015   PLT 408.0 (H) 12/23/2015   GLUCOSE 98 12/23/2015   CHOL 193 12/23/2015   TRIG 299.0 (H) 12/23/2015   HDL 30.40 (L) 12/23/2015   LDLDIRECT 124.0 12/23/2015   LDLCALC 131 (H) 10/12/2013   ALT 28 12/23/2015   AST 19 12/23/2015   NA 141 12/23/2015   K 3.4 (L) 12/23/2015   CL 102 12/23/2015   CREATININE 1.08 12/23/2015   BUN 11 12/23/2015   CO2 32 12/23/2015   TSH 1.43 12/23/2015   PSA 0.79 12/16/2014   INR 0.91 12/21/2012   HGBA1C 6.0 12/23/2015    Dg Chest 2 View  Result Date: 07/11/2015 CLINICAL DATA:  Cough and fever with shortness of  breath for 2 days EXAM: CHEST  2 VIEW COMPARISON:  April 09, 2013 FINDINGS: Lungs are clear. Heart size and pulmonary vascularity are normal. No adenopathy. No bone lesions. IMPRESSION: No edema or consolidation. Electronically Signed   By: Bretta BangWilliam  Woodruff III M.D.   On: 07/11/2015 20:32   Dg Chest 2 View  Result Date: 07/01/2016 CLINICAL DATA:  Cough and congestion for 2 weeks EXAM: CHEST  2 VIEW COMPARISON:  07/11/2015 FINDINGS: The heart size and mediastinal contours are within normal limits. Both lungs are clear. The visualized skeletal structures are unremarkable. IMPRESSION: No active cardiopulmonary disease. Electronically Signed   By: Alcide CleverMark  Lukens M.D.   On: 07/01/2016 15:26   Assessment & Plan:   Maisie Fushomas was seen today for uri, cough and hypertension.  Diagnoses and all orders for this visit:  Need for  immunization against influenza -     Flu Vaccine QUAD 36+ mos IM  Essential hypertension, benign- his blood pressure is not adequately well controlled, I will monitor his electrolytes and renal function today. Will restart his thiazide diuretic and will switch him to nebivolol. -     triamterene-hydrochlorothiazide (MAXZIDE-25) 37.5-25 MG tablet; Take 1 tablet by mouth daily. -     nebivolol (BYSTOLIC) 10 MG tablet; Take 1 tablet (10 mg total) by mouth daily. -     CBC with Differential/Platelet; Future -     Basic metabolic panel; Future -     Magnesium; Future  Back pain of lumbar region with sciatica -     traMADol (ULTRAM) 50 MG tablet; Take 1 tablet (50 mg total) by mouth every 6 (six) hours as needed.  Hypokalemia -     Basic metabolic panel; Future -     Magnesium; Future  Acute nonseasonal allergic rhinitis due to pollen  Cough- his chest x-ray is negative for pneumonia but his symptoms are consistent with viral URI and asthma. Will treat as listed below. -     DG Chest 2 View; Future -     HYDROcodone-homatropine (HYCODAN) 5-1.5 MG/5ML syrup; Take 5 mLs by mouth  every 8 (eight) hours as needed for cough.  Viral URI with cough- he has no symptoms suspicious for bacterial infection so I did not prescribe an antibiotic today. Will offer Hycodan as needed. -     HYDROcodone-homatropine (HYCODAN) 5-1.5 MG/5ML syrup; Take 5 mLs by mouth every 8 (eight) hours as needed for cough.  Mild intermittent asthma without complication- he has a slightly elevated FeNO score at 28 indicating that he most likely has an inflammatory or allergic phenomenon in his lungs consistent with asthma. I've therefore asked him to start you to a using a LABA/ICS combination. I gave him a sample of Breo and showed him how to use it. He demonstrated proficiency with its use. -     fluticasone furoate-vilanterol (BREO ELLIPTA) 100-25 MCG/INH AEPB; Inhale 1 puff into the lungs daily.   I have discontinued Mr. Graham Biotin, carvedilol, diclofenac sodium, DULoxetine, and gabapentin. I am also having him start on nebivolol, HYDROcodone-homatropine, traMADol, and fluticasone furoate-vilanterol. Additionally, I am having him maintain his b complex vitamins, cholecalciferol, vitamin C, omega-3 acid ethyl esters, albuterol, potassium chloride SA, and triamterene-hydrochlorothiazide.  Meds ordered this encounter  Medications  . triamterene-hydrochlorothiazide (MAXZIDE-25) 37.5-25 MG tablet    Sig: Take 1 tablet by mouth daily.    Dispense:  90 tablet    Refill:  1  . nebivolol (BYSTOLIC) 10 MG tablet    Sig: Take 1 tablet (10 mg total) by mouth daily.    Dispense:  90 tablet    Refill:  1  . HYDROcodone-homatropine (HYCODAN) 5-1.5 MG/5ML syrup    Sig: Take 5 mLs by mouth every 8 (eight) hours as needed for cough.    Dispense:  120 mL    Refill:  0  . traMADol (ULTRAM) 50 MG tablet    Sig: Take 1 tablet (50 mg total) by mouth every 6 (six) hours as needed.    Dispense:  75 tablet    Refill:  3  . fluticasone furoate-vilanterol (BREO ELLIPTA) 100-25 MCG/INH AEPB    Sig: Inhale 1 puff  into the lungs daily.    Dispense:  30 each    Refill:  11     Follow-up: Return in about 3 weeks (around 07/22/2016).  Sanda Linger, MD

## 2016-08-24 ENCOUNTER — Other Ambulatory Visit: Payer: Self-pay | Admitting: Internal Medicine

## 2016-08-24 DIAGNOSIS — I10 Essential (primary) hypertension: Secondary | ICD-10-CM

## 2016-08-24 MED ORDER — CARVEDILOL 3.125 MG PO TABS: 3.1250 mg | ORAL_TABLET | Freq: Two times a day (BID) | ORAL | 1 refills | Status: DC

## 2016-09-05 IMAGING — NM NM MISC PROCEDURE
6 series · 36 of 36 positions shown · non-contrast
Comparison: none

[Series 1: wbr_s-proj_st stress · 6.51mm/px · 6 of 512 frames shown (1 of 2)]
[frame 43/512]
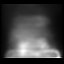
[frame 128/512]
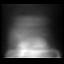
[frame 214/512]
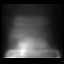
[frame 299/512]
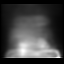
[frame 384/512]
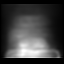
[frame 470/512]
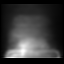

[Series 1: wbr_s-proj_st stress · 6.51mm/px · 6 of 64 frames shown (2 of 2)]
[frame 6/64]
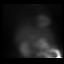
[frame 16/64]
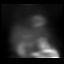
[frame 27/64]
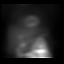
[frame 38/64]
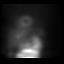
[frame 48/64]
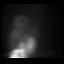
[frame 59/64]
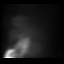

[Series 1: stress · 6.51mm/px · 6 of 511 frames shown (1 of 2)]
[frame 43/511]
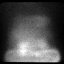
[frame 128/511]
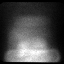
[frame 213/511]
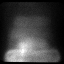
[frame 298/511]
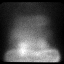
[frame 383/511]
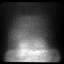
[frame 469/511]
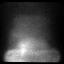

[Series 1: stress · 6.51mm/px · 6 of 64 frames shown (2 of 2)]
[frame 6/64]
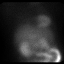
[frame 16/64]
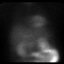
[frame 27/64]
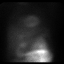
[frame 38/64]
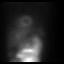
[frame 48/64]
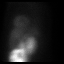
[frame 59/64]
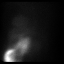

[Series 2: wbr_r-proj_st rest · 6.51mm/px · 6 of 64 frames shown]
[frame 6/64]
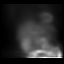
[frame 16/64]
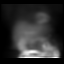
[frame 27/64]
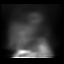
[frame 38/64]
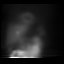
[frame 48/64]
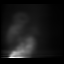
[frame 59/64]
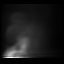

[Series 2: rest · 6.51mm/px · 6 of 64 frames shown]
[frame 6/64]
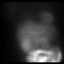
[frame 16/64]
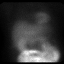
[frame 27/64]
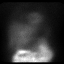
[frame 38/64]
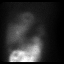
[frame 48/64]
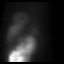
[frame 59/64]
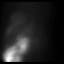

[36 of 36 positions shown; findings below may reference images not displayed]

Canned report from images found in remote index.

Refer to host system for actual result text.

## 2017-02-01 ENCOUNTER — Emergency Department (HOSPITAL_COMMUNITY): Payer: PRIVATE HEALTH INSURANCE

## 2017-02-01 ENCOUNTER — Encounter (HOSPITAL_COMMUNITY): Payer: Self-pay | Admitting: Emergency Medicine

## 2017-02-01 DIAGNOSIS — M79602 Pain in left arm: Secondary | ICD-10-CM | POA: Insufficient documentation

## 2017-02-01 DIAGNOSIS — M79605 Pain in left leg: Secondary | ICD-10-CM | POA: Insufficient documentation

## 2017-02-01 DIAGNOSIS — Z5321 Procedure and treatment not carried out due to patient leaving prior to being seen by health care provider: Secondary | ICD-10-CM | POA: Insufficient documentation

## 2017-02-01 MED ORDER — IBUPROFEN 400 MG PO TABS
ORAL_TABLET | ORAL | Status: AC
  Filled 2017-02-01: qty 1

## 2017-02-01 MED ORDER — IBUPROFEN 400 MG PO TABS
400.0000 mg | ORAL_TABLET | Freq: Once | ORAL | Status: AC | PRN
  Administered 2017-02-01: 400 mg via ORAL
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
## 2017-02-02 ENCOUNTER — Ambulatory Visit: Payer: Self-pay | Admitting: Nurse Practitioner

## 2017-02-02 ENCOUNTER — Ambulatory Visit (INDEPENDENT_AMBULATORY_CARE_PROVIDER_SITE_OTHER): Payer: PRIVATE HEALTH INSURANCE | Admitting: Internal Medicine

## 2017-02-02 ENCOUNTER — Encounter: Payer: Self-pay | Admitting: Internal Medicine

## 2017-02-02 ENCOUNTER — Telehealth: Payer: Self-pay | Admitting: Internal Medicine

## 2017-02-02 ENCOUNTER — Emergency Department (HOSPITAL_COMMUNITY)
Admission: EM | Admit: 2017-02-02 | Discharge: 2017-02-02 | Disposition: A | Discharge status: Home / Self Care | Payer: PRIVATE HEALTH INSURANCE | Attending: Emergency Medicine | Admitting: Emergency Medicine

## 2017-02-02 VITALS — BP 138/80 | HR 83 | Temp 98.7°F | Resp 16 | Ht 71.0 in | Wt 289.0 lb

## 2017-02-02 DIAGNOSIS — Z23 Encounter for immunization: Secondary | ICD-10-CM

## 2017-02-02 DIAGNOSIS — I10 Essential (primary) hypertension: Secondary | ICD-10-CM

## 2017-02-02 DIAGNOSIS — S81811A Laceration without foreign body, right lower leg, initial encounter: Secondary | ICD-10-CM

## 2017-02-02 MED ORDER — TRIAMTERENE-HCTZ 37.5-25 MG PO TABS: 1.0000 | ORAL_TABLET | Freq: Every day | ORAL | 1 refills | Status: DC

## 2017-02-02 MED ORDER — CARVEDILOL 12.5 MG PO TABS: 12.5000 mg | ORAL_TABLET | Freq: Two times a day (BID) | ORAL | 1 refills | Status: DC

## 2017-02-02 MED ORDER — AMOXICILLIN-POT CLAVULANATE 875-125 MG PO TABS: 1.0000 | ORAL_TABLET | Freq: Two times a day (BID) | ORAL | 0 refills | Status: DC

## 2017-02-02 MED ORDER — AMOXICILLIN-POT CLAVULANATE 875-125 MG PO TABS: 1.0000 | ORAL_TABLET | Freq: Two times a day (BID) | ORAL | 0 refills | Status: AC
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
## 2017-02-02 NOTE — Patient Instructions (Signed)

## 2017-02-02 NOTE — Telephone Encounter (Signed)
Pt gave wrong pharmacy please resend all rxs from today to   [id number] [place] ([facility] [address] - [address]

## 2017-02-02 NOTE — Telephone Encounter (Signed)
Resent rx to [facility]../lmb 

## 2017-02-17 ENCOUNTER — Encounter: Payer: Self-pay | Admitting: Internal Medicine

## 2017-02-17 ENCOUNTER — Other Ambulatory Visit: Payer: PRIVATE HEALTH INSURANCE

## 2017-02-17 ENCOUNTER — Ambulatory Visit (INDEPENDENT_AMBULATORY_CARE_PROVIDER_SITE_OTHER): Payer: PRIVATE HEALTH INSURANCE | Admitting: Internal Medicine

## 2017-02-17 VITALS — BP 140/90 | HR 69 | Temp 98.2°F | Resp 16 | Ht 71.0 in | Wt 297.5 lb

## 2017-02-17 DIAGNOSIS — L089 Local infection of the skin and subcutaneous tissue, unspecified: Secondary | ICD-10-CM | POA: Insufficient documentation

## 2017-02-17 DIAGNOSIS — T148XXA Other injury of unspecified body region, initial encounter: Secondary | ICD-10-CM

## 2017-02-17 DIAGNOSIS — S81811D Laceration without foreign body, right lower leg, subsequent encounter: Secondary | ICD-10-CM

## 2017-02-17 DIAGNOSIS — Z23 Encounter for immunization: Secondary | ICD-10-CM

## 2017-02-17 NOTE — Progress Notes (Signed)
Subjective:  Patient ID: [name] [name], male    DOB: 07/30/1970  Age: 46 y.o. MRN: [id number]  CC: Wound Check   HPI Rico Massar Shafer presents for a wound check - He had a right thigh laceration repair about 10 days ago. 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  Outpatient Medications Prior to Visit  Medication Sig Dispense Refill  . albuterol (PROVENTIL HFA;VENTOLIN HFA) 108 (90 Base) MCG/ACT inhaler Inhale 2 puffs into the lungs every 6 (six) hours as needed for wheezing or shortness of breath. 1 Inhaler 0  . Ascorbic Acid (VITAMIN C) 1000 MG tablet Take 1,000 mg by mouth daily. Reported on 07/15/2015    . b complex vitamins capsule Take 1 capsule by mouth daily. Reported on 07/15/2015    . carvedilol (COREG) 12.5 MG tablet Take 1 tablet (12.5 mg total) by mouth 2 (two) times daily with a meal. 180 tablet 1  . cholecalciferol (VITAMIN D) 1000 UNITS tablet Take 1,000 Units by mouth daily. Reported on 07/15/2015    . fluticasone furoate-vilanterol (BREO ELLIPTA) 100-25 MCG/INH AEPB Inhale 1 puff into the lungs daily. 30 each 11  . omega-3 acid ethyl esters (LOVAZA) 1 g capsule Take 2 capsules (2 g total) by mouth 2 (two) times daily. 120 capsule 11  . potassium chloride SA (K-DUR,KLOR-CON) 20 MEQ tablet Take 1 tablet (20 mEq total) by mouth daily. 90 tablet 1  . traMADol (ULTRAM) 50 MG tablet Take 1 tablet (50 mg total) by mouth every 6 (six) hours as needed. 75 tablet 3  . triamterene-hydrochlorothiazide (MAXZIDE-25) 37.5-25 MG tablet Take 1 tablet by mouth daily. 90 tablet 1   No facility-administered medications prior to visit.     ROS Review of Systems  Constitutional: Negative for chills, fatigue and fever.  HENT: Negative.   Eyes: Negative.   Respiratory: Negative.  Negative for chest tightness.   Cardiovascular: Negative.  Negative for chest  pain, palpitations and leg swelling.  Gastrointestinal: Negative for abdominal pain.  Skin: Positive for wound. Negative for color change, pallor and rash.  Hematological: Negative for adenopathy. Does not bruise/bleed easily.  Psychiatric/Behavioral: Negative.     Objective:  BP 140/90   Pulse 69   Temp 98.2 F (36.8 C) (Oral)   Resp 16   Ht  (1.803 m)   Wt 297 lb 8 oz (134.9 kg)   SpO2 96%   BMI 41.49 kg/m   BP Readings from Last 3 Encounters:  02/17/17 140/90  02/02/17 138/80  02/01/17 (!) 186/107    Wt Readings from Last 3 Encounters:  02/17/17 297 lb 8 oz (134.9 kg)  02/02/17 289 lb (131.1 kg)  02/01/17 283 lb (128.4 kg)    Physical Exam  Musculoskeletal:       Right lower leg: He exhibits swelling, deformity and laceration. He exhibits no tenderness, no bony tenderness and no edema.       Legs: The wound has not completely closed but the edges are approximating. The base and sides of the wound are filled with granulation tissue. There is no eschar or purulent exudate.  There is mild swelling over the medial aspect of the right lower extremity around the wound. There is no erythema, induration, fluctuance, warmth, or streaking.    Lab Results  Component Value Date   WBC 10.0 12/23/2015   HGB  14.5 12/23/2015   HCT 42.1 12/23/2015   PLT 408.0 (H) 12/23/2015   GLUCOSE 98 12/23/2015   CHOL 193 12/23/2015   TRIG 299.0 (H) 12/23/2015   HDL 30.40 (L) 12/23/2015   LDLDIRECT 124.0 12/23/2015   LDLCALC 131 (H) 10/12/2013   ALT 28 12/23/2015   AST 19 12/23/2015   NA 141 12/23/2015   K 3.4 (L) 12/23/2015   CL 102 12/23/2015   CREATININE 1.08 12/23/2015   BUN 11 12/23/2015   CO2 32 12/23/2015   TSH 1.43 12/23/2015   PSA 0.79 12/16/2014   INR 0.91 12/21/2012   HGBA1C 6.0 12/23/2015    No results found.  Assessment & Plan:   Darel was seen today for wound check.  Diagnoses and all orders for this visit:  Laceration of right lower leg, subsequent  encounter- sutures removed. Culture is positive for staph. Will treat with Bactrim. -     Wound culture; Future  Wound infection, posttraumatic -     Wound culture; Future -     sulfamethoxazole-trimethoprim (BACTRIM DS,SEPTRA DS) 800-160 MG tablet; Take 1 tablet by mouth 2 (two) times daily.  Need for influenza vaccination -     Flu Vaccine QUAD 36+ mos IM   I am having Mr. Premo start on sulfamethoxazole-trimethoprim. I am also having him maintain his b complex vitamins, cholecalciferol, vitamin C, omega-3 acid ethyl esters, albuterol, potassium chloride SA, traMADol, fluticasone furoate-vilanterol, triamterene-hydrochlorothiazide, and carvedilol.  Meds ordered this encounter  Medications  . sulfamethoxazole-trimethoprim (BACTRIM DS,SEPTRA DS) 800-160 MG tablet    Sig: Take 1 tablet by mouth 2 (two) times daily.    Dispense:  14 tablet    Refill:  0     Follow-up: Return in about 1 week (around 02/24/2017).  Sanda Linger, MD

## 2017-02-17 NOTE — Patient Instructions (Addendum)

## 2017-02-19 ENCOUNTER — Encounter: Payer: Self-pay | Admitting: Internal Medicine

## 2017-02-19 MED ORDER — SULFAMETHOXAZOLE-TRIMETHOPRIM 800-160 MG PO TABS: 1.0000 | ORAL_TABLET | Freq: Two times a day (BID) | ORAL | 0 refills | Status: DC

## 2017-02-20 LAB — WOUND CULTURE
MICRO NUMBER:: 81041513
SPECIMEN QUALITY: ADEQUATE

## 2017-02-24 ENCOUNTER — Other Ambulatory Visit: Payer: Self-pay | Admitting: Internal Medicine

## 2017-02-24 ENCOUNTER — Encounter: Payer: Self-pay | Admitting: Internal Medicine

## 2017-02-24 ENCOUNTER — Ambulatory Visit (INDEPENDENT_AMBULATORY_CARE_PROVIDER_SITE_OTHER): Payer: PRIVATE HEALTH INSURANCE | Admitting: Internal Medicine

## 2017-02-24 ENCOUNTER — Other Ambulatory Visit: Payer: PRIVATE HEALTH INSURANCE

## 2017-02-24 VITALS — BP 142/90 | HR 72 | Temp 98.4°F | Ht 71.0 in | Wt 298.0 lb

## 2017-02-24 DIAGNOSIS — T148XXA Other injury of unspecified body region, initial encounter: Secondary | ICD-10-CM

## 2017-02-24 DIAGNOSIS — L089 Local infection of the skin and subcutaneous tissue, unspecified: Secondary | ICD-10-CM

## 2017-02-24 DIAGNOSIS — I10 Essential (primary) hypertension: Secondary | ICD-10-CM

## 2017-02-24 MED ORDER — RIFAMPIN 300 MG PO CAPS: 300.0000 mg | ORAL_CAPSULE | Freq: Two times a day (BID) | ORAL | 0 refills | Status: AC

## 2017-02-24 MED ORDER — SULFAMETHOXAZOLE-TRIMETHOPRIM 800-160 MG PO TABS: 1.0000 | ORAL_TABLET | Freq: Two times a day (BID) | ORAL | 0 refills | Status: AC

## 2017-02-24 NOTE — Patient Instructions (Signed)
Wound Infection A wound infection happens when germs start to grow in the wound. Germs that cause wound infections are most commonly bacteria. Other types of infections can occur as well. In some cases, infection can cause the wound to break open. Wound infections need treatment. If a wound infection is left untreated, complications can occur. This may include an infection in your bloodstream (sepsis) or in a bone (osteomyelitis). What are the causes? This condition is caused by germs growing in the wound. What increases the risk? The following factors may make you more likely to develop this condition:  Having a weak body defense system (immune system).  Having diabetes.  Taking steroid medicines for a long time (chronic use).  Smoking.  Older age.  Being overweight.  What are the signs or symptoms? Symptoms of this condition include:  Having more redness, swelling, or pain at the wound site.  Having more blood, pus, or fluid at the wound site.  A bad smell coming from a wound or bandage (dressing).  Having a fever.  Feeling tired or fatigued.  How is this diagnosed? This condition is diagnosed with a medical history and physical exam. You may also have blood tests. How is this treated? This condition is treated with an antibiotic medicine. The infection should improve 24-48 hours after you start antibiotics. Any redness around the wound should stop spreading, and the wound should be less painful. Follow these instructions at home: Medicines  Take or apply over-the-counter and prescription medicines only as told by your health care provider.  If you were prescribed antibiotic medicine, take or apply it as told by your health care provider. Do not stop using the antibiotic even if your condition improves. Wound care  Clean the wound each day or as told by your health care provider. ? Wash the wound with mild soap and water. ? Rinse the wound with water to remove all  soap. ? Pat the wound dry with a clean towel. Do not rub it.  Follow instructions from your health care provider about how to take care of your wound. Make sure you: ? Wash your hands with soap and water before you change your dressing. If soap and water are not available, use hand sanitizer. ? Change your dressing as told by your health care provider. ? Leave stitches (sutures), skin glue, or adhesive strips in place, if this applies. These skin closures may need to stay in place for 2 weeks or longer. If adhesive strip edges start to loosen and curl up, you may trim the loose edges. Do not remove adhesive strips completely unless your health care provider tells you to do that. Some wounds are left open to heal on their own.  Check your wound every day for signs of infection. Watch for: ? More redness, swelling, or pain. ? More fluid or blood. ? Warmth. ? Pus or a bad smell. General instructions  Keep the dressing dry until your health care provider says it can be removed.  Do not take baths, swim, use a hot tub, or do anything that would put your wound underwater until your health care provider approves.  Raise (elevate) the injured area above the level of your heart while you are sitting or lying down.  Do not scratch or pick at the wound.  Keep all follow-up visits as told by your health care provider. This is important. Contact a health care provider if:  Your pain is not controlled with medicine.  You have more   redness, swelling, or pain around your wound.  You have more fluid or blood coming from your wound.  Your wound feels warm to the touch.  You have pus coming from your wound.  You continue to notice a bad smell coming from your wound or your dressing.  Your wound that was closed breaks open. Get help right away if:  You have a red streak going away from your wound.  You have a fever. This information is not intended to replace advice given to you by your  health care provider. Make sure you discuss any questions you have with your health care provider. Document Released: 02/13/2003 Document Revised: 10/29/2015 Document Reviewed: 11/04/2014 Elsevier Interactive Patient Education  2018 [employer] Inc[employer]  

## 2017-02-24 NOTE — Progress Notes (Signed)
Subjective:  Patient ID: [name] [name], male    DOB: [birth year]  Age: 46 y.o. MRN: [id number]  CC: Wound Check   HPI [name] [name] presents for a recheck of wound on right upper inner calf. I last saw him a week ago and there was some concern for wound infection so a culture was done. it was positive for staph. He has been taking Bactrim with moderate symptom relief. There had been swelling and drainage but that has resolved. He is concerned that the wound has not closed but he has not noticed any pain or redness.  Outpatient Medications Prior to Visit  Medication Sig Dispense Refill  . albuterol (PROVENTIL HFA;VENTOLIN HFA) 108 (90 Base) MCG/ACT inhaler Inhale 2 puffs into the lungs every 6 (six) hours as needed for wheezing or shortness of breath. 1 Inhaler 0  . Ascorbic Acid (VITAMIN C) 1000 MG tablet Take 1,000 mg by mouth daily. Reported on 07/15/2015    . b complex vitamins capsule Take 1 capsule by mouth daily. Reported on 07/15/2015    . carvedilol (COREG) 12.5 MG tablet Take 1 tablet (12.5 mg total) by mouth 2 (two) times daily with a meal. 180 tablet 1  . cholecalciferol (VITAMIN D) 1000 UNITS tablet Take 1,000 Units by mouth daily. Reported on 07/15/2015    . fluticasone furoate-vilanterol (BREO ELLIPTA) 100-25 MCG/INH AEPB Inhale 1 puff into the lungs daily. 30 each 11  . omega-3 acid ethyl esters (LOVAZA) 1 g capsule Take 2 capsules (2 g total) by mouth 2 (two) times daily. 120 capsule 11  . potassium chloride SA (K-DUR,KLOR-CON) 20 MEQ tablet Take 1 tablet (20 mEq total) by mouth daily. 90 tablet 1  . traMADol (ULTRAM) 50 MG tablet Take 1 tablet (50 mg total) by mouth every 6 (six) hours as needed. 75 tablet 3  . sulfamethoxazole-trimethoprim (BACTRIM DS,SEPTRA DS) 800-160 MG tablet Take 1 tablet by mouth 2 (two) times daily. 14 tablet 0  . triamterene-hydrochlorothiazide (MAXZIDE-25) 37.5-25 MG tablet Take 1 tablet by mouth daily. 90 tablet 1   No facility-administered  medications prior to visit.     ROS Review of Systems  Constitutional: Negative for chills and fever.  HENT: Negative.   Eyes: Negative.   Respiratory: Negative.   Cardiovascular: Negative.   Gastrointestinal: Negative.   Musculoskeletal: Negative.   Skin: Positive for wound. Negative for color change.  Hematological: Negative.   Psychiatric/Behavioral: Negative.     Objective:  BP (!) 142/90 (BP Location: Left Arm, Patient Position: Sitting, Cuff Size: Normal)   Pulse 72   Temp 98.4 F (36.9 C) (Oral)   Ht  (1.803 m)   Wt 298 lb (135.2 kg)   SpO2 94%   BMI 41.56 kg/m   BP Readings from Last 3 Encounters:  02/24/17 (!) 142/90  02/17/17 140/90  02/02/17 138/80    Wt Readings from Last 3 Encounters:  02/24/17 298 lb (135.2 kg)  02/17/17 297 lb 8 oz (134.9 kg)  02/02/17 289 lb (131.1 kg)    Physical Exam  Musculoskeletal:       Legs:   Lab Results  Component Value Date   WBC 10.0 12/23/2015   HGB 14.5 12/23/2015   HCT 42.1 12/23/2015   PLT 408.0 (H) 12/23/2015   GLUCOSE 98 12/23/2015   CHOL 193 12/23/2015   TRIG 299.0 (H) 12/23/2015   HDL 30.40 (L) 12/23/2015   LDLDIRECT 124.0 12/23/2015   LDLCALC 131 (H) 10/12/2013   ALT 28 12/23/2015  AST 19 12/23/2015   NA 141 12/23/2015   K 3.4 (L) 12/23/2015   CL 102 12/23/2015   CREATININE 1.08 12/23/2015   BUN 11 12/23/2015   CO2 32 12/23/2015   TSH 1.43 12/23/2015   PSA 0.79 12/16/2014   INR 0.91 12/21/2012   HGBA1C 6.0 12/23/2015    No results found.  Assessment & Plan:   Sheron was seen today for wound check.  Diagnoses and all orders for this visit:  Wound infection, posttraumatic- the preliminary culture results show a few white blood cells and gram-positive cocci. Will continue Bactrim but since this has not resolved will also add on a second antibiotic for double coverage of MRSA. -     WOUND CULTURE; Future -     sulfamethoxazole-trimethoprim (BACTRIM DS,SEPTRA DS) 800-160 MG tablet;  Take 1 tablet by mouth 2 (two) times daily. -     rifampin (RIFADIN) 300 MG capsule; Take 1 capsule (300 mg total) by mouth 2 (two) times daily.   I am having [name] [name] start on rifampin. I am also having him maintain his b complex vitamins, cholecalciferol, vitamin C, omega-3 acid ethyl esters, albuterol, potassium chloride SA, traMADol, fluticasone furoate-vilanterol, carvedilol, and sulfamethoxazole-trimethoprim.  Meds ordered this encounter  Medications  . sulfamethoxazole-trimethoprim (BACTRIM DS,SEPTRA DS) 800-160 MG tablet    Sig: Take 1 tablet by mouth 2 (two) times daily.    Dispense:  14 tablet    Refill:  0  . rifampin (RIFADIN) 300 MG capsule    Sig: Take 1 capsule (300 mg total) by mouth 2 (two) times daily.    Dispense:  14 capsule    Refill:  0     Follow-up: Return in about 3 weeks (around 03/17/2017).  Sanda Linger, MD

## 2017-02-25 NOTE — Telephone Encounter (Signed)
[facility] [address] ([address] [place] - [address] [phone] (Phone) [phone] (Fax)   [facility]. He needs it sent to [facility].

## 2017-02-25 NOTE — Telephone Encounter (Signed)
Resent to [facility]../lmb 

## 2017-02-27 LAB — WOUND CULTURE
MICRO NUMBER:: 81073179
SPECIMEN QUALITY:: ADEQUATE

## 2017-03-17 ENCOUNTER — Ambulatory Visit: Payer: Self-pay | Admitting: Internal Medicine

## 2017-03-31 ENCOUNTER — Ambulatory Visit: Payer: Self-pay | Admitting: Internal Medicine

## 2017-04-07 ENCOUNTER — Ambulatory Visit: Payer: Self-pay | Admitting: Internal Medicine

## 2017-12-09 ENCOUNTER — Telehealth: Payer: Self-pay | Admitting: Internal Medicine

## 2017-12-09 DIAGNOSIS — I10 Essential (primary) hypertension: Secondary | ICD-10-CM

## 2017-12-09 MED ORDER — CARVEDILOL 12.5 MG PO TABS: 12.5000 mg | ORAL_TABLET | Freq: Two times a day (BID) | ORAL | 1 refills | Status: DC

## 2017-12-09 MED ORDER — TRIAMTERENE-HCTZ 37.5-25 MG PO TABS: 1.0000 | ORAL_TABLET | Freq: Every day | ORAL | 1 refills | Status: AC

## 2017-12-09 NOTE — Telephone Encounter (Signed)
Triamterene-HCTZ refill Last Refill:02/25/18 # 90 1 RF Last OV: 02/17/17 PCP: Dr Yetta BarreJones [facility]acy:[facility] [address]  carvedilol refill Last Refill:02/02/17 # 180 1 RF Last OV: 02/17/17

## 2017-12-09 NOTE — Telephone Encounter (Signed)
Copied from CRM [phone]. Topic: Quick Communication - Rx Refill/Question >> Dec 09, 2017 11:58 AM Floria RavelingStovall, Shana A wrote: Medication:  triamterene-hydrochlorothiazide (MAXZIDE-25) 37.5-25 MG tablet [[id number]][[id number]]  carvedilol (COREG) 12.5 MG tablet [[id number]][[id number]]   Has the patient contacted their pharmacy? No  (Agent: If no, request that the patient contact the pharmacy for the refill.) (Agent: If yes, when and what did the pharmacy advise?)  Preferred Pharmacy (with phone number or street name): [facility] [address], [place], [place] [address]  [phone] stated that he has relocated to [place] but is going to drive to keep coming to see Dr Yetta Barrejones.  Last seen 01/29/2017   Agent: Please be advised that RX refills may take up to 3 business days. We ask that you follow-up with your pharmacy.

## 2017-12-26 ENCOUNTER — Ambulatory Visit: Payer: Self-pay | Admitting: Internal Medicine

## 2018-01-23 ENCOUNTER — Ambulatory Visit: Payer: Self-pay | Admitting: Internal Medicine

## 2018-08-07 ENCOUNTER — Telehealth: Payer: Self-pay | Admitting: Internal Medicine

## 2018-08-07 NOTE — Telephone Encounter (Signed)
Tried to call pt. Line rang and then went busy.   We will not be able to refill medication due to LOV was 2018. We will refill or start rx at time of OV depending on sx and/or vitals.

## 2018-08-07 NOTE — Telephone Encounter (Signed)
Copied from CRM [id number]. Topic: Quick Communication - Rx Refill/Question >> Aug 07, 2018 11:02 AM Jens Som A wrote: Medication: triamterene-hydrochlorothiazide (MAXZIDE-25) 37.5-25 MG tablet [[id number]] , carvedilol (COREG) 12.5 MG tablet [[id number]]  -apt scheduled 3/26/ 20  Has the patient contacted their pharmacy? Yes  (Agent: If no, request that the patient contact the pharmacy for the refill.) (Agent: If yes, when and what did the pharmacy advise?)  Preferred Pharmacy (with phone number or street name): [facility] [address], [place] - [address] [phone] (Phone) [phone] (Fax)    Agent: Please be advised that RX refills may take up to 3 business days. We ask that you follow-up with your pharmacy.

## 2018-08-24 ENCOUNTER — Encounter: Payer: Self-pay | Admitting: Internal Medicine

## 2024-03-19 ENCOUNTER — Other Ambulatory Visit: Payer: Self-pay

## 2024-03-19 ENCOUNTER — Emergency Department (HOSPITAL_COMMUNITY)
Admission: EM | Admit: 2024-03-19 | Discharge: 2024-03-20 | Disposition: A | Payer: Self-pay | Attending: Emergency Medicine | Admitting: Emergency Medicine

## 2024-03-19 ENCOUNTER — Encounter (HOSPITAL_COMMUNITY): Payer: Self-pay

## 2024-03-19 DIAGNOSIS — Z79899 Other long term (current) drug therapy: Secondary | ICD-10-CM | POA: Insufficient documentation

## 2024-03-19 DIAGNOSIS — I10 Essential (primary) hypertension: Secondary | ICD-10-CM | POA: Insufficient documentation

## 2024-03-19 DIAGNOSIS — Z7984 Long term (current) use of oral hypoglycemic drugs: Secondary | ICD-10-CM | POA: Insufficient documentation

## 2024-03-19 DIAGNOSIS — R739 Hyperglycemia, unspecified: Secondary | ICD-10-CM

## 2024-03-19 DIAGNOSIS — E1165 Type 2 diabetes mellitus with hyperglycemia: Secondary | ICD-10-CM | POA: Insufficient documentation

## 2024-03-19 LAB — COMPREHENSIVE METABOLIC PANEL WITH GFR
ALT: 33 U/L (ref 0–44)
AST: 28 U/L (ref 15–41)
Albumin: 3.7 g/dL (ref 3.5–5.0)
Alkaline Phosphatase: 86 U/L (ref 38–126)
Anion gap: 12 (ref 5–15)
BUN: 11 mg/dL (ref 6–20)
CO2: 26 mmol/L (ref 22–32)
Calcium: 9 mg/dL (ref 8.9–10.3)
Chloride: 96 mmol/L — ABNORMAL LOW (ref 98–111)
Creatinine, Ser: 1.02 mg/dL (ref 0.61–1.24)
GFR, Estimated: >60 mL/min (ref >60)
Glucose, Bld: 415 mg/dL — ABNORMAL HIGH (ref 70–99)
Potassium: 2.9 mmol/L — ABNORMAL LOW (ref 3.5–5.1)
Sodium: 134 mmol/L — ABNORMAL LOW (ref 135–145)
Total Bilirubin: 1 mg/dL (ref 0.0–1.2)
Total Protein: 7.4 g/dL (ref 6.5–8.1)

## 2024-03-19 LAB — URINALYSIS, ROUTINE W REFLEX MICROSCOPIC
Bacteria, UA: NONE SEEN
Bilirubin Urine: NEGATIVE
Glucose, UA: >=500 mg/dL — AB
Hgb urine dipstick: NEGATIVE
Ketones, ur: NEGATIVE mg/dL
Leukocytes,Ua: NEGATIVE
Nitrite: NEGATIVE
Protein, ur: NEGATIVE mg/dL
Specific Gravity, Urine: 1.024 (ref 1.005–1.030)
pH: 6 (ref 5.0–8.0)

## 2024-03-19 LAB — CBC WITH DIFFERENTIAL/PLATELET
Abs Immature Granulocytes: 0.02 K/uL (ref 0.00–0.07)
Basophils Absolute: 0.1 K/uL (ref 0.0–0.1)
Basophils Relative: 1 %
Eosinophils Absolute: 0.1 K/uL (ref 0.0–0.5)
Eosinophils Relative: 1 %
HCT: 43.9 % (ref 39.0–52.0)
Hemoglobin: 15.8 g/dL (ref 13.0–17.0)
Immature Granulocytes: 0 %
Lymphocytes Relative: 30 %
Lymphs Abs: 2.6 K/uL (ref 0.7–4.0)
MCH: 27.8 pg (ref 26.0–34.0)
MCHC: 36 g/dL (ref 30.0–36.0)
MCV: 77.2 fL — ABNORMAL LOW (ref 80.0–100.0)
Monocytes Absolute: 0.5 K/uL (ref 0.1–1.0)
Monocytes Relative: 6 %
Neutro Abs: 5.6 K/uL (ref 1.7–7.7)
Neutrophils Relative %: 62 %
Platelets: 355 K/uL (ref 150–400)
RBC: 5.69 MIL/uL (ref 4.22–5.81)
RDW: 12.9 % (ref 11.5–15.5)
WBC: 9 K/uL (ref 4.0–10.5)
nRBC: 0 % (ref 0.0–0.2)

## 2024-03-19 LAB — I-STAT VENOUS BLOOD GAS, ED
Acid-Base Excess: 4 mmol/L — ABNORMAL HIGH (ref 0.0–2.0)
Bicarbonate: 29.5 mmol/L — ABNORMAL HIGH (ref 20.0–28.0)
Calcium, Ion: 1.11 mmol/L — ABNORMAL LOW (ref 1.15–1.40)
HCT: 45 % (ref 39.0–52.0)
Hemoglobin: 15.3 g/dL (ref 13.0–17.0)
O2 Saturation: 69 %
Potassium: 2.8 mmol/L — ABNORMAL LOW (ref 3.5–5.1)
Sodium: 134 mmol/L — ABNORMAL LOW (ref 135–145)
TCO2: 31 mmol/L (ref 22–32)
pCO2, Ven: 45.1 mmHg (ref 44–60)
pH, Ven: 7.424 (ref 7.25–7.43)
pO2, Ven: 36 mmHg (ref 32–45)

## 2024-03-19 LAB — TROPONIN I (HIGH SENSITIVITY)
Troponin I (High Sensitivity): 14 ng/L (ref <18)
Troponin I (High Sensitivity): 16 ng/L (ref <18)

## 2024-03-19 LAB — BETA-HYDROXYBUTYRIC ACID: Beta-Hydroxybutyric Acid: 0.13 mmol/L (ref 0.05–0.27)

## 2024-03-19 MED ORDER — LISINOPRIL 10 MG PO TABS: 10.0000 mg | ORAL_TABLET | Freq: Every day | ORAL | 3 refills | Status: AC

## 2024-03-19 MED ORDER — METFORMIN HCL ER 750 MG PO TB24: 750.0000 mg | ORAL_TABLET | Freq: Every day | ORAL | 3 refills | Status: AC

## 2024-03-19 MED ORDER — SODIUM CHLORIDE 0.9 % IV BOLUS
1000.0000 mL | Freq: Once | INTRAVENOUS | Status: AC
  Administered 2024-03-19: 1000 mL via INTRAVENOUS

## 2024-03-19 MED ORDER — CARVEDILOL 6.25 MG PO TABS: 6.2500 mg | ORAL_TABLET | Freq: Two times a day (BID) | ORAL | 3 refills | Status: AC

## 2024-03-19 MED ORDER — MAGNESIUM OXIDE -MG SUPPLEMENT 400 (240 MG) MG PO TABS
800.0000 mg | ORAL_TABLET | Freq: Once | ORAL | Status: AC
  Administered 2024-03-19: 800 mg via ORAL
  Filled 2024-03-19: qty 2

## 2024-03-19 MED ORDER — POTASSIUM CHLORIDE 20 MEQ PO PACK: 60.0000 meq | PACK | Freq: Every day | ORAL | Status: DC

## 2024-03-19 MED ORDER — AMLODIPINE BESYLATE 5 MG PO TABS: 5.0000 mg | ORAL_TABLET | Freq: Every day | ORAL | 3 refills | Status: AC

## 2024-03-19 NOTE — ED Triage Notes (Signed)
 Pt sent from PCP due to hyperglycemia and A1C 13.7. C/o frequent urination and brain fog.

## 2024-03-19 NOTE — ED Provider Triage Note (Signed)
 Emergency Medicine Provider Triage Evaluation Note  [name] [name] , a 53 y.o. male  was evaluated in triage.  Patient reports that his blood sugar was elevated in the 400s at his primary appointment today, hemoglobin A1c 13, and they recommended he come here for further evaluation.  He reports that he has been urinating more frequently, but denies any other symptoms such as abdominal pain, increased thirst, vomiting.  He also reports he has been having some pain in the left side of his chest intermittently for the past couple of days.  Chest pain is nonexertional, nonpleuritic, does not radiate to the back.  No chest pain currently.  No shortness of breath.  Review of Systems  Positive: As above Negative: As above  Physical Exam  BP (!) 168/102   Pulse 100   Temp 98.1 F (36.7 C)   Resp 18   Ht 5' 11 (1.803 m)   Wt 129.3 kg   SpO2 95%   BMI 39.75 kg/m  Gen:   Awake, no distress   Resp:  Normal effort  MSK:   Moves extremities without difficulty    Medical Decision Making  Medically screening exam initiated at 4:53 PM.  Appropriate orders placed.  Mohanad Carsten Smedberg was informed that the remainder of the evaluation will be completed by another provider, this initial triage assessment does not replace that evaluation, and the importance of remaining in the ED until their evaluation is complete.     Veta Palma, PA-C 03/19/24 [phone]

## 2024-03-19 NOTE — ED Triage Notes (Signed)
 Patient states he has not taken HTN medicine in a while, does not have diabetes medicine because at last check it was borderline. Patient reports changes to vision since May but he assumed he was just getting older.

## 2024-03-19 NOTE — Discharge Instructions (Signed)
 Please follow-up with your family doctor in clinic.  Please call them tomorrow and let them know about your visit here and see when they can see you in clinic.  Please return for chest pain difficulty breathing fever or confusion.

## 2024-03-19 NOTE — Progress Notes (Signed)
 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 Please see scanned document in Media.  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  I have reviewed hyperglycemic emergency education and diabetes education with him in detail including negative consequences of diabetes left untreated as per AVS.  He denies's signs and symptoms of hyperglycemia or hypertension emergency on review of systems.he states he does take blood pressure medications but admits he has not taken diabetes medication in quite some time. He states he does have a primary care provider.  I have advised him to go for follow-up today at urgent care or emergency room for definitive treatment recommendations for diabetes to prevent complications. He agrees with plan, verbalizes understanding and without any further questions when asked

## 2024-03-19 NOTE — ED Provider Notes (Signed)
 [place] [facility]MENT AT [facility] Provider Note   CSN: [id number] Arrival date & time: 03/19/24  1522     Patient presents with: Hyperglycemia and Hypertension   [name] [name] is a 53 y.o. male.   53 yo M with a chief complaints of uncontrolled blood sugar and high blood pressure.  Patient tells me he used to be on medications for this but he stopped taking them maybe about 8 months ago.  He has not followed up with his primary care provider.  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  The provider there told him he should go to the ER to get started on medications.  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  Dizzy at times.  Feels like his vision has gotten slowly worse.   Hyperglycemia Hypertension       Prior to Admission medications   Medication Sig Start Date End Date Taking? Authorizing Provider  amLODipine  (NORVASC ) 5 MG tablet Take 1 tablet (5 mg total) by mouth daily. 03/19/24  Yes Emil Share, DO  carvedilol  (COREG ) 6.25 MG tablet Take 1 tablet (6.25 mg total) by mouth 2 (two) times daily with a meal. 03/19/24  Yes Emil Share, DO  lisinopril (ZESTRIL) 10 MG tablet Take 1 tablet (10 mg total) by mouth daily. 03/19/24  Yes Emil Share, DO  metFORMIN (GLUCOPHAGE-XR) 750 MG 24 hr tablet Take 1 tablet (750 mg total) by mouth daily with breakfast. 03/19/24  Yes Emil Share, DO  albuterol  (PROVENTIL  HFA;VENTOLIN  HFA) 108 (90 Base) MCG/ACT inhaler Inhale 2 puffs into the lungs every 6 (six) hours as needed for wheezing or shortness of breath. 09/22/15   Morris Bread, FNP  Ascorbic Acid (VITAMIN C) 1000 MG tablet Take 1,000 mg by mouth daily. Reported on 07/15/2015    [provider]  b complex vitamins capsule Take 1 capsule by mouth daily. Reported on 07/15/2015    [provider]  cholecalciferol (VITAMIN D) 1000 UNITS tablet Take 1,000 Units by mouth daily.  Reported on 07/15/2015    [provider]  fluticasone  furoate-vilanterol (BREO ELLIPTA ) 100-25 MCG/INH AEPB Inhale 1 puff into the lungs daily. 07/01/16   Joshua Debby CROME, MD  omega-3 acid ethyl esters (LOVAZA ) 1 g capsule Take 2 capsules (2 g total) by mouth 2 (two) times daily. 07/15/15   Joshua Debby CROME, MD  potassium chloride  SA (K-DUR,KLOR-CON ) 20 MEQ tablet Take 1 tablet (20 mEq total) by mouth daily. 12/23/15   Joshua Debby CROME, MD  traMADol  (ULTRAM ) 50 MG tablet Take 1 tablet (50 mg total) by mouth every 6 (six) hours as needed. 07/01/16   Joshua Debby CROME, MD  triamterene -hydrochlorothiazide (MAXZIDE-25) 37.5-25 MG tablet Take 1 tablet by mouth daily. 12/09/17   Joshua Debby CROME, MD    Allergies: Patient has no known allergies.    Review of Systems  Updated Vital Signs BP (!) 140/83 (BP Location: Right Arm)   Pulse 77   Temp 98.4 F (36.9 C)   Resp 14   Ht 5' 11 (1.803 m)   Wt 129.3 kg   SpO2 92%   BMI 39.75 kg/m   Physical Exam Vitals and nursing note reviewed.  Constitutional:      Appearance: He is well-developed.  HENT:     Head: Normocephalic and atraumatic.  Eyes:     Pupils: Pupils are equal, round, and reactive to light.  Neck:  Vascular: No JVD.  Cardiovascular:     Rate and Rhythm: Normal rate and regular rhythm.     Heart sounds: No murmur heard.    No friction rub. No gallop.  Pulmonary:     Effort: No respiratory distress.     Breath sounds: No wheezing.  Abdominal:     General: There is no distension.     Tenderness: There is no abdominal tenderness. There is no guarding or rebound.  Musculoskeletal:        General: Normal range of motion.     Cervical back: Normal range of motion and neck supple.  Skin:    Coloration: Skin is not pale.     Findings: No rash.  Neurological:     Mental Status: He is alert and oriented to person, place, and time.  Psychiatric:        Behavior: Behavior normal.     (all labs ordered are listed, but only  abnormal results are displayed) Labs Reviewed  CBC WITH DIFFERENTIAL/PLATELET - Abnormal; Notable for the following components:      Result Value   MCV 77.2 (*)    All other components within normal limits  COMPREHENSIVE METABOLIC PANEL WITH GFR - Abnormal; Notable for the following components:   Sodium 134 (*)    Potassium 2.9 (*)    Chloride 96 (*)    Glucose, Bld 415 (*)    All other components within normal limits  URINALYSIS, ROUTINE W REFLEX MICROSCOPIC - Abnormal; Notable for the following components:   Glucose, UA >=500 (*)    All other components within normal limits  I-STAT VENOUS BLOOD GAS, ED - Abnormal; Notable for the following components:   Bicarbonate 29.5 (*)    Acid-Base Excess 4.0 (*)    Sodium 134 (*)    Potassium 2.8 (*)    Calcium, Ion 1.11 (*)    All other components within normal limits  BETA-HYDROXYBUTYRIC ACID  CBG MONITORING, ED  TROPONIN I (HIGH SENSITIVITY)  TROPONIN I (HIGH SENSITIVITY)    EKG: EKG Interpretation Date/Time:  Monday March 19 2024 20:25:08 EDT Ventricular Rate:  80 PR Interval:  164 QRS Duration:  104 QT Interval:  382 QTC Calculation: 440 R Axis:   2  Text Interpretation: Normal sinus rhythm Minimal voltage criteria for LVH, may be normal variant ( [employer] product ) Nonspecific T wave abnormality Abnormal ECG flipped t waves in lateral leads seen on prior No significant change since last tracing Confirmed by Emil Share [id number]) on 03/19/2024 9:34:03 PM  Radiology: No results found.   Procedures   Medications Ordered in the ED  sodium chloride  0.9 % bolus 1,000 mL (has no administration in time range)  potassium chloride  (KLOR-CON ) packet 60 mEq (has no administration in time range)  magnesium oxide (MAG-OX) tablet 800 mg (has no administration in time range)                                    Medical Decision Making Risk OTC drugs. Prescription drug management.   53 yo M with a chief complaints of having a  high blood pressure and an A1c that is elevated at his DOT physical.  He has been on medication for blood pressure and diabetes but has not been taking them for at least 8 months.  He came here to be initiated back on medications.  He has some symptoms that seem to come  and gone.  No obvious focal symptoms here on my history.  Blood sugar is elevated but without acidosis or anion gap.  Will give a bolus of IV fluids.  Initiate on metformin and restart his blood pressure medications.  PCP follow-up.  10:54 PM:  I have discussed the diagnosis/risks/treatment options with the patient.  Evaluation and diagnostic testing in the emergency department does not suggest an emergent condition requiring admission or immediate intervention beyond what has been performed at this time.  They will follow up with PCP. We also discussed returning to the ED immediately if new or worsening sx occur. We discussed the sx which are most concerning (e.g., sudden worsening pain, fever, inability to tolerate by mouth) that necessitate immediate return. Medications administered to the patient during their visit and any new prescriptions provided to the patient are listed below.  Medications given during this visit Medications  sodium chloride  0.9 % bolus 1,000 mL (has no administration in time range)  potassium chloride  (KLOR-CON ) packet 60 mEq (has no administration in time range)  magnesium oxide (MAG-OX) tablet 800 mg (has no administration in time range)     The patient appears reasonably screen and/or stabilized for discharge and I doubt any other medical condition or other [facility] requiring further screening, evaluation, or treatment in the ED at this time prior to discharge.       Final diagnoses:  Uncontrolled hypertension  Hyperglycemia    ED Discharge Orders          Ordered    metFORMIN (GLUCOPHAGE-XR) 750 MG 24 hr tablet  Daily with breakfast        03/19/24 2202    lisinopril (ZESTRIL) 10 MG tablet  Daily         03/19/24 2202    carvedilol  (COREG ) 6.25 MG tablet  2 times daily with meals        03/19/24 2202    amLODipine  (NORVASC ) 5 MG tablet  Daily        03/19/24 2202               Emil Share, DO 03/19/24 2254
